# Patient Record
Sex: Female | Born: 1990 | Race: White | Hispanic: No | Marital: Single | Smoking: Current every day smoker
Health system: Southern US, Community
[De-identification: ages and names within clinical notes are randomized; demographics above are authoritative.]

## PROBLEM LIST (undated history)

## (undated) DIAGNOSIS — I471 Supraventricular tachycardia, unspecified: Secondary | ICD-10-CM

## (undated) HISTORY — PX: FRACTURE SURGERY: SHX138

## (undated) HISTORY — PX: CARDIAC ELECTROPHYSIOLOGY STUDY AND ABLATION: SHX1294

---

## 2014-12-11 ENCOUNTER — Encounter (HOSPITAL_COMMUNITY): Payer: Self-pay | Admitting: Emergency Medicine

## 2014-12-11 ENCOUNTER — Emergency Department (HOSPITAL_COMMUNITY): Payer: Self-pay

## 2014-12-11 ENCOUNTER — Emergency Department (HOSPITAL_COMMUNITY)
Admission: EM | Admit: 2014-12-11 | Discharge: 2014-12-11 | Discharge status: Against Medical Advice | Payer: Self-pay | Attending: Emergency Medicine | Admitting: Emergency Medicine

## 2014-12-11 DIAGNOSIS — Z8679 Personal history of other diseases of the circulatory system: Secondary | ICD-10-CM | POA: Insufficient documentation

## 2014-12-11 DIAGNOSIS — Z79899 Other long term (current) drug therapy: Secondary | ICD-10-CM | POA: Insufficient documentation

## 2014-12-11 DIAGNOSIS — R079 Chest pain, unspecified: Secondary | ICD-10-CM | POA: Insufficient documentation

## 2014-12-11 DIAGNOSIS — R002 Palpitations: Secondary | ICD-10-CM | POA: Insufficient documentation

## 2014-12-11 DIAGNOSIS — R000 Tachycardia, unspecified: Secondary | ICD-10-CM

## 2014-12-11 HISTORY — DX: Supraventricular tachycardia, unspecified: I47.10

## 2014-12-11 HISTORY — DX: Supraventricular tachycardia: I47.1

## 2014-12-11 NOTE — ED Notes (Addendum)
Pt. Stated  " i wanna go home now, im feeling better, i wanna sit around home than here.'

## 2014-12-11 NOTE — ED Notes (Signed)
Pt presents with chest pain, pt states she has hx of SVT with cardiac ablations x 2. Pt states she goes in/out of SVT daily, takes Cardizem. Pt states she awoke with heart racing, took Cardizem but heart continued to race.

## 2014-12-11 NOTE — ED Notes (Signed)
Bed: WA03 Expected date:  Expected time:  Means of arrival:  Comments: 

## 2014-12-11 NOTE — ED Provider Notes (Signed)
Medical screening examination/treatment/procedure(s) were performed by non-physician practitioner and as supervising physician I was immediately available for consultation/collaboration.   EKG Interpretation   Date/Time:  Saturday December 11 2014 04:02:21 EST Ventricular Rate:  112 PR Interval:  173 QRS Duration: 89 QT Interval:  383 QTC Calculation: 523 R Axis:   83 Text Interpretation:  Sinus tachycardia Borderline T wave abnormalities  Prolonged QT interval Confirmed by [facility]  [facility], Laikynn Pollio ([id number]) on  12/11/2014 5:42:33 AM        EKG Interpretation   Date/Time:  Saturday December 11 2014 04:02:21 EST Ventricular Rate:  112 PR Interval:  173 QRS Duration: 89 QT Interval:  383 QTC Calculation: 523 R Axis:   83 Text Interpretation:  Sinus tachycardia Borderline T wave abnormalities  Prolonged QT interval Confirmed by [facility]  [facility], Haneen Bernales ([id number]) on  12/11/2014 5:42:33 AM       Ayvah Caroll Smitty CordsK Magnum Lunde-Rasch, MD 12/11/14 [phone]

## 2014-12-11 NOTE — ED Provider Notes (Signed)
CSN: [id number]     Arrival date & time 12/11/14  0341 History   First MD Initiated Contact with Patient 12/11/14 [phone]     Chief Complaint  Patient presents with  . Chest Pain     (Consider location/radiation/quality/duration/timing/severity/associated sxs/prior Treatment) HPI Kristy Hubbard is a 24 y.o. female with hx of SVT, [facility], presents to ED with complaint of chest pain and palpitations. Patient states her symptoms began around 3 AM. She states [place] is visiting her friend here. States she normally takes Cardizem when she starts feeling palpitations, states tooka dose after she felt her symptoms, and when her palpitations did not improve she decided to come to the ER. Upon arrival to ED, patient states she is feeling much better. She refused any blood work or x-rays by Education administratornursing staff, and wants to go home. She denies any symptoms at this time. She states she has SVT every day, and that is why she is on Cardizem. She denies any recent illness, no new medications, no new over the counter medications.   Past Medical History  Diagnosis Date  . SVT (supraventricular tachycardia)    Past Surgical History  Procedure Laterality Date  . Cardiac electrophysiology study and ablation    . Fracture surgery Left     Arm   No family history on file. History  Substance Use Topics  . Smoking status: Never Smoker   . Smokeless tobacco: Not on file  . Alcohol Use: Yes     Comment: social   OB History    No data available     Review of Systems  Constitutional: Negative for fever and chills.  Respiratory: Positive for chest tightness. Negative for cough and shortness of breath.   Cardiovascular: Positive for chest pain and palpitations. Negative for leg swelling.  Gastrointestinal: Negative for nausea, vomiting, abdominal pain and diarrhea.  Genitourinary: Negative for dysuria and flank pain.  Musculoskeletal: Negative for  myalgias, arthralgias, neck pain and neck stiffness.  Skin: Negative for rash.  Neurological: Negative for dizziness, weakness and headaches.  All other systems reviewed and are negative.     Allergies  Morphine and related  Home Medications   Prior to Admission medications   Medication Sig Start Date End Date Taking? Authorizing Provider  diltiazem (CARDIZEM) 120 MG tablet Take 120 mg by mouth 3 (three) times daily as needed.   Yes Historical Provider, MD  medroxyPROGESTERone (DEPO-PROVERA) 150 MG/ML injection Inject 150 mg into the muscle every 3 (three) months.   Yes Historical Provider, MD   BP 138/84 mmHg  Pulse 97  Temp(Src) 97.8 F (36.6 C) (Oral)  Resp 23  Ht 4\' 11"  (1.499 m)  Wt 125 lb (56.7 kg)  BMI 25.23 kg/m2  SpO2 100%  LMP 12/10/2014 Physical Exam  Constitutional: She is oriented to person, place, and time. She appears well-developed and well-nourished. No distress.  Eyes: Conjunctivae are normal.  Neck: Neck supple.  Cardiovascular: Normal rate, regular rhythm and normal heart sounds.   No murmur heard. Pulmonary/Chest: Effort normal and breath sounds normal. No respiratory distress. She has no wheezes. She has no rales.  Musculoskeletal: She exhibits no edema.  Neurological: She is alert and oriented to person, place, and time.  Skin: Skin is warm and dry.  Nursing note and vitals reviewed.   ED Course  Procedures (including critical care time) Labs Review Labs Reviewed  CBC WITH DIFFERENTIAL/PLATELET  I-STAT CHEM 8, ED  Rosezena Sensor, ED  POC URINE PREG, ED    Imaging Review No results found.   EKG Interpretation   Date/Time:  Saturday December 11 2014 04:02:21 EST Ventricular Rate:  112 PR Interval:  173 QRS Duration: 89 QT Interval:  383 QTC Calculation: 523 R Axis:   83 Text Interpretation:  Sinus tachycardia Borderline T wave abnormalities  Prolonged QT interval Confirmed by [facility]  MD, APRIL ([id number]) on  12/11/2014  5:42:33 AM      MDM   Final diagnoses:  Chest pain, unspecified chest pain type     patient with history of SVT, states felt like her heart was racing earlier this morning, no improvement after taking her Cardizem so came to the emergency department. Patient is followed by cardiology in [facility]. She states "I get SVT daily, and take my Cardizem as needed." By the time I saw the patient, she has been in emergency for over 2 hours. Her initial EKG just showed sinus tachycardia. No evidence of SVT. She has never been to the ER here before with similar complaints. Care everywhere reviewed, no recent hospital visits for SVT, although there is a mention that she has that as her history. Given patient's initial sinus tachycardia, being on birth control, and having chest pain palpitations, labwork and chest x-ray ordered. Patient refused any testing, stating that she now feels back to normal. She denies any current complaints. She appears to be back in sinus rhythm with heart rate in the 90s. Discussed with Dr. Daun Peacock who recommended AMA discharge. At this time without any lab work or imaging cannot rule out PE or any other cardiac abnormality.  Filed Vitals:   12/11/14 0356 12/11/14 0517  BP: 134/90 138/84  Pulse: 130 97  Temp: 97.8 F (36.6 C)   TempSrc: Oral   Resp: 15 23  Height:  (1.499 m)   Weight: 125 lb (56.7 kg)   SpO2: 100% 100%     Lottie Mussel, PA-C 12/11/14 0920

## 2014-12-11 NOTE — ED Notes (Signed)
Pt. Signed AMA

## 2014-12-11 NOTE — ED Notes (Signed)
Pt. REFUSED all lab works ordered by MD. Stated " i knew what's my problem and now im feeling fine. I just want to go home." to notify MD.

## 2015-02-13 ENCOUNTER — Emergency Department (HOSPITAL_COMMUNITY)
Admission: EM | Admit: 2015-02-13 | Discharge: 2015-02-13 | Disposition: A | Discharge status: Home / Self Care | Payer: BLUE CROSS/BLUE SHIELD | Attending: Emergency Medicine | Admitting: Emergency Medicine

## 2015-02-13 ENCOUNTER — Encounter (HOSPITAL_COMMUNITY): Payer: Self-pay | Admitting: Emergency Medicine

## 2015-02-13 DIAGNOSIS — X58XXXA Exposure to other specified factors, initial encounter: Secondary | ICD-10-CM | POA: Diagnosis not present

## 2015-02-13 DIAGNOSIS — Z720 Tobacco use: Secondary | ICD-10-CM | POA: Diagnosis not present

## 2015-02-13 DIAGNOSIS — Y929 Unspecified place or not applicable: Secondary | ICD-10-CM | POA: Diagnosis not present

## 2015-02-13 DIAGNOSIS — Z8679 Personal history of other diseases of the circulatory system: Secondary | ICD-10-CM | POA: Diagnosis not present

## 2015-02-13 DIAGNOSIS — H5712 Ocular pain, left eye: Secondary | ICD-10-CM | POA: Diagnosis not present

## 2015-02-13 DIAGNOSIS — Y939 Activity, unspecified: Secondary | ICD-10-CM | POA: Diagnosis not present

## 2015-02-13 DIAGNOSIS — Y999 Unspecified external cause status: Secondary | ICD-10-CM | POA: Insufficient documentation

## 2015-02-13 DIAGNOSIS — T1592XA Foreign body on external eye, part unspecified, left eye, initial encounter: Secondary | ICD-10-CM | POA: Diagnosis present

## 2015-02-13 MED ORDER — FLUORESCEIN SODIUM 1 MG OP STRP
1.0000 | ORAL_STRIP | Freq: Once | OPHTHALMIC | Status: AC
  Administered 2015-02-13: 1 via OPHTHALMIC
  Filled 2015-02-13: qty 1

## 2015-02-13 NOTE — ED Notes (Signed)
C/o contact stuck on L eye x 5 days.  Denies pain.

## 2015-02-13 NOTE — ED Provider Notes (Signed)
CSN: [id number]     Arrival date & time 02/13/15  1901 History  This chart was scribed for non-physician provider Emilia Beck, PA-C, working with Richardean Canal, MD by Phillis Haggis, ED Scribe. This patient was seen in room TR04C/TR04C and patient care was started at 8:10 PM.   Chief Complaint  Patient presents with  . Foreign Body in Eye   Patient is a 24 y.o. female presenting with foreign body in eye. The history is provided by the patient. No language interpreter was used.  Foreign Body in Eye  HPI Comments: Kristy Hubbard is a 24 y.o. female who presents to the Emergency Department complaining of a foreign body in the eye onset 5 days ago. She states that she got a contact stuck in her left eye and was able to move it around but not able to get it out. She denies pain to the area.    Past Medical History  Diagnosis Date  . SVT (supraventricular tachycardia)    Past Surgical History  Procedure Laterality Date  . Cardiac electrophysiology study and ablation    . Fracture surgery Left     Arm   No family history on file. History  Substance Use Topics  . Smoking status: Current Every Day Smoker  . Smokeless tobacco: Not on file  . Alcohol Use: Yes     Comment: social   OB History    No data available     Review of Systems  Eyes: Positive for visual disturbance.  All other systems reviewed and are negative.  Allergies  Morphine and related  Home Medications   Prior to Admission medications   Medication Sig Start Date End Date Taking? Authorizing Provider  diltiazem (CARDIZEM) 120 MG tablet Take 120 mg by mouth 3 (three) times daily as needed.    Historical Provider, MD  medroxyPROGESTERone (DEPO-PROVERA) 150 MG/ML injection Inject 150 mg into the muscle every 3 (three) months.    Historical Provider, MD   BP 126/91 mmHg  Pulse 112  Temp(Src) 98.7 F (37.1 C) (Oral)  Resp 16  Ht 5' (1.524 m)  Wt 129 lb 12.8 oz (58.877 kg)  BMI 25.35 kg/m2  SpO2  100%  Physical Exam  Constitutional: She is oriented to person, place, and time. She appears well-developed and well-nourished. No distress.  HENT:  Head: Normocephalic and atraumatic.  Mouth/Throat: Oropharynx is clear and moist.  Eyes: Conjunctivae and EOM are normal. Pupils are equal, round, and reactive to light.  No foreign body seen. No corneal abrasion noted on fluorescin dye exam.   Neck: Normal range of motion. Neck supple.  Cardiovascular: Normal rate, regular rhythm and normal heart sounds.   Pulmonary/Chest: Effort normal and breath sounds normal. No respiratory distress.  Abdominal: Soft. She exhibits no distension. There is no tenderness. There is no rebound.  Musculoskeletal: Normal range of motion. She exhibits no edema.  Neurological: She is alert and oriented to person, place, and time. No sensory deficit. Coordination normal.  Skin: Skin is warm and dry.  Psychiatric: She has a normal mood and affect. Her behavior is normal.  Nursing note and vitals reviewed.   ED Course  Procedures (including critical care time) DIAGNOSTIC STUDIES: Oxygen Saturation is 100% on room air, normal by my interpretation.    COORDINATION OF CARE: 8:12 PM-Discussed treatment plan which includes numbing eye drops and removal of foreign body with pt at bedside and pt agreed to plan.   Labs Review Labs Reviewed - No data  to display  Imaging Review No results found.   EKG Interpretation None      MDM   Final diagnoses:  Left eye pain    No foreign body or corneal abrasion noted.   I personally performed the services described in this documentation, which was scribed in my presence. The recorded information has been reviewed and is accurate.     Emilia BeckKaitlyn Jorene Kaylor, PA-C 02/13/15 2029  Richardean Canalavid H Yao, MD 02/14/15 [phone]

## 2015-02-13 NOTE — Discharge Instructions (Signed)
Follow up with your Ophthalmologist for further evaluation of your eye.

## 2015-06-02 ENCOUNTER — Emergency Department (HOSPITAL_COMMUNITY): Payer: BLUE CROSS/BLUE SHIELD

## 2015-06-02 ENCOUNTER — Encounter (HOSPITAL_COMMUNITY): Payer: Self-pay | Admitting: *Deleted

## 2015-06-02 ENCOUNTER — Emergency Department (HOSPITAL_COMMUNITY)
Admission: EM | Admit: 2015-06-02 | Discharge: 2015-06-02 | Disposition: A | Discharge status: Home / Self Care | Payer: BLUE CROSS/BLUE SHIELD | Attending: Emergency Medicine | Admitting: Emergency Medicine

## 2015-06-02 DIAGNOSIS — I471 Supraventricular tachycardia: Secondary | ICD-10-CM | POA: Insufficient documentation

## 2015-06-02 DIAGNOSIS — Z793 Long term (current) use of hormonal contraceptives: Secondary | ICD-10-CM | POA: Diagnosis not present

## 2015-06-02 DIAGNOSIS — R079 Chest pain, unspecified: Secondary | ICD-10-CM | POA: Diagnosis not present

## 2015-06-02 DIAGNOSIS — R002 Palpitations: Secondary | ICD-10-CM | POA: Diagnosis present

## 2015-06-02 DIAGNOSIS — Z720 Tobacco use: Secondary | ICD-10-CM | POA: Insufficient documentation

## 2015-06-02 DIAGNOSIS — R0602 Shortness of breath: Secondary | ICD-10-CM | POA: Diagnosis not present

## 2015-06-02 LAB — CBC
HCT: 44.7 % (ref 36.0–46.0)
Hemoglobin: 15.6 g/dL — ABNORMAL HIGH (ref 12.0–15.0)
MCH: 29.4 pg (ref 26.0–34.0)
MCHC: 34.9 g/dL (ref 30.0–36.0)
MCV: 84.3 fL (ref 78.0–100.0)
PLATELETS: 285 10*3/uL (ref 150–400)
RBC: 5.3 MIL/uL — ABNORMAL HIGH (ref 3.87–5.11)
RDW: 11.9 % (ref 11.5–15.5)
WBC: 11.2 10*3/uL — ABNORMAL HIGH (ref 4.0–10.5)

## 2015-06-02 LAB — BASIC METABOLIC PANEL
Anion gap: 12 (ref 5–15)
BUN: 12 mg/dL (ref 6–20)
CHLORIDE: 105 mmol/L (ref 101–111)
CO2: 22 mmol/L (ref 22–32)
CREATININE: 0.97 mg/dL (ref 0.44–1.00)
Calcium: 9.9 mg/dL (ref 8.9–10.3)
GFR calc Af Amer: >60 mL/min (ref >60)
GFR calc non Af Amer: >60 mL/min (ref >60)
GLUCOSE: 110 mg/dL — AB (ref 65–99)
Potassium: 4.1 mmol/L (ref 3.5–5.1)
SODIUM: 139 mmol/L (ref 135–145)

## 2015-06-02 LAB — I-STAT TROPONIN, ED: Troponin i, poc: 0 ng/mL (ref 0.00–0.08)

## 2015-06-02 MED ORDER — LORAZEPAM 2 MG/ML IJ SOLN
1.0000 mg | Freq: Once | INTRAMUSCULAR | Status: AC
  Administered 2015-06-02: 1 mg via INTRAVENOUS
  Filled 2015-06-02: qty 1

## 2015-06-02 MED ORDER — METOPROLOL TARTRATE 1 MG/ML IV SOLN
10.0000 mg | INTRAVENOUS | Status: AC
  Administered 2015-06-02: 10 mg via INTRAVENOUS
  Filled 2015-06-02: qty 10

## 2015-06-02 MED ORDER — DILTIAZEM HCL 25 MG/5ML IV SOLN
20.0000 mg | Freq: Once | INTRAVENOUS | Status: AC
  Administered 2015-06-02: 20 mg via INTRAVENOUS
  Filled 2015-06-02: qty 5

## 2015-06-02 MED ORDER — ADENOSINE 6 MG/2ML IV SOLN
12.0000 mg | Freq: Once | INTRAVENOUS | Status: AC
  Administered 2015-06-02: 12 mg via INTRAVENOUS
  Filled 2015-06-02: qty 4

## 2015-06-02 MED ORDER — SODIUM CHLORIDE 0.9 % IV BOLUS (SEPSIS)
1000.0000 mL | Freq: Once | INTRAVENOUS | Status: AC
  Administered 2015-06-02: 1000 mL via INTRAVENOUS

## 2015-06-02 NOTE — ED Notes (Signed)
Bed side Commode placed in room.

## 2015-06-02 NOTE — Discharge Instructions (Signed)
Pharmaceutical Cardioversion Pharmaceutical cardioversion is the use of medicine to convert an abnormal heart rhythm back to normal. This treatment may be used when the heart rhythm is not dangerous and vital signs, such as blood pressure, stay in the normal range. Medicine may be used first to treat a fast and irregular heart rhythm (atrial fibrillation). You may have noticed it only because you had mild shortness of breath, were tired, felt dizzy, or felt your heart beating fast.  WHAT CAN I EXPECT?  You may have to stay in the hospital for a few days so your heart rhythm can be monitored all the time.  There are several medicines that may be used depending on the rhythm of your heart.  It may take a combination of medicines before the rhythm changes.  If medicine does not work, Lobbyist cardioversion may be used. This involves putting you briefly to sleep and applying two paddles to the front of your chest to deliver a quick electrical current. This adjusts your heart rhythm back to normal.  Once your heart rhythm is normal again, you may still need to take medicine to increase your chances of keeping a regular heart rhythm. Document Released: 07/23/2006 Document Revised: 07/22/2013 Document Reviewed: 04/08/2013 [facility] Patient Information 2015 [place], [place]. This information is not intended to replace advice given to you by your health care provider. Make sure you discuss any questions you have with your health care provider.

## 2015-06-02 NOTE — ED Notes (Signed)
PT placed on ZOLL. MD at bedside. PT having runs of SVT in the 160s with no response to vagal maneuvers. Pt placed on 2L [place] O2. Suction set up at bedside.

## 2015-06-02 NOTE — ED Notes (Signed)
Pt HR dropped form 137 to 81 but went back up to 130s.

## 2015-06-02 NOTE — ED Notes (Signed)
Pt tried vagal maneuver. HR came down to the 70s for several beats but is now 105.

## 2015-06-02 NOTE — ED Notes (Signed)
Pt no longer has chest pain.

## 2015-06-02 NOTE — ED Notes (Signed)
Pt reports palpitations with central chest pain and shortness of breath starting about a hour ago. Pt states she has a hx of SVT and cardiac ablations. Pt reports taking her Cardizem without relief.

## 2015-06-02 NOTE — ED Notes (Signed)
MD updated about patients HR.

## 2015-06-02 NOTE — ED Provider Notes (Signed)
CSN: [id number]     Arrival date & time 06/02/15  1959 History   First MD Initiated Contact with Patient 06/02/15 2029     Chief Complaint  Patient presents with  . Palpitations  . Tachycardia     (Consider location/radiation/quality/duration/timing/severity/associated sxs/prior Treatment) Patient is a 24 y.o. female presenting with palpitations. The history is provided by the patient.  Palpitations Palpitations quality:  Fast Onset quality:  Sudden Duration:  2 hours Timing:  Constant Progression:  Unchanged Chronicity:  New Context: anxiety and caffeine   Relieved by:  Nothing Worsened by:  Nothing Ineffective treatments: Diltiazem. Associated symptoms: chest pain and shortness of breath   Associated symptoms: no dizziness, no nausea and no vomiting     24 yo F with a chief complaint of palpitations. Patient states this happens to her just about every day. Patient has diltiazem at home which she takes for this. Normally this resolves in about 15 minutes but this one has persisted. Associated with some shortness of breath and some chest pressure. Patient states having to her multiple times and she is getting a medicine but is not sure what exactly that she is given that resolved this. Had an ablation and [facility].  Past Medical History  Diagnosis Date  . SVT (supraventricular tachycardia)    Past Surgical History  Procedure Laterality Date  . Cardiac electrophysiology study and ablation    . Fracture surgery Left     Arm   History reviewed. No pertinent family history. Social History  Substance Use Topics  . Smoking status: Current Every Day Smoker  . Smokeless tobacco: None  . Alcohol Use: Yes     Comment: social   OB History    No data available     Review of Systems  Constitutional: Negative for fever and chills.  HENT: Negative for congestion and rhinorrhea.   Eyes: Negative for redness and visual disturbance.  Respiratory: Positive for  shortness of breath. Negative for wheezing.   Cardiovascular: Positive for chest pain. Negative for palpitations.  Gastrointestinal: Negative for nausea and vomiting.  Genitourinary: Negative for dysuria and urgency.  Musculoskeletal: Negative for myalgias and arthralgias.  Skin: Negative for pallor and wound.  Neurological: Negative for dizziness and headaches.      Allergies  Morphine and related  Home Medications   Prior to Admission medications   Medication Sig Start Date End Date Taking? Authorizing Provider  diltiazem (CARDIZEM) 120 MG tablet Take 120 mg by mouth 3 (three) times daily as needed. For a-fib   Yes Historical Provider, MD  medroxyPROGESTERone (DEPO-PROVERA) 150 MG/ML injection Inject 150 mg into the muscle every 3 (three) months.   Yes Historical Provider, MD   BP 129/86 mmHg  Pulse 97  Temp(Src) 98.4 F (36.9 C) (Oral)  Resp 18  Ht 4\' 11"  (1.499 m)  Wt 139 lb (63.05 kg)  BMI 28.06 kg/m2  SpO2 100% Physical Exam  Constitutional: She is oriented to person, place, and time. She appears well-developed and well-nourished. No distress.  HENT:  Head: Normocephalic and atraumatic.  Eyes: EOM are normal. Pupils are equal, round, and reactive to light.  Neck: Normal range of motion. Neck supple.  Cardiovascular: Regular rhythm.  Tachycardia present.  Exam reveals no gallop and no friction rub.   No murmur heard. Pulmonary/Chest: Effort normal. She has no wheezes. She has no rales.  Abdominal: Soft. She exhibits no distension. There is no tenderness. There is no rebound and no guarding.  Musculoskeletal: She exhibits  no edema or tenderness.  Neurological: She is alert and oriented to person, place, and time.  Skin: Skin is warm and dry. She is not diaphoretic.  Psychiatric: She has a normal mood and affect. Her behavior is normal.    ED Course  Procedures (including critical care time) Labs Review Labs Reviewed  BASIC METABOLIC PANEL - Abnormal; Notable for  the following:    Glucose, Bld 110 (*)    All other components within normal limits  CBC - Abnormal; Notable for the following:    WBC 11.2 (*)    RBC 5.30 (*)    Hemoglobin 15.6 (*)    All other components within normal limits  I-STAT TROPOININ, ED    Imaging Review Dg Chest Portable 1 View  06/02/2015   CLINICAL DATA:  SVT.  Complains of centralized chest pain.  EXAM: PORTABLE CHEST - 1 VIEW  COMPARISON:  None.  FINDINGS: Cardiac pad overlies the left lower chest. Both lungs are clear. Negative for a pneumothorax. Heart size is normal. The trachea is midline.  IMPRESSION: No focal chest disease.   Electronically Signed   By: Richarda Overlie M.D.   On: 06/02/2015 22:00   I have personally reviewed and evaluated these images and lab results as part of my medical decision-making.   EKG Interpretation   Date/Time:  Thursday June 02 2015 20:09:52 EDT Ventricular Rate:  144 PR Interval:    QRS Duration: 86 QT Interval:  356 QTC Calculation: 551 R Axis:   85 Text Interpretation:  Supraventricular tachycardia ST \\T \ T wave  abnormality, consider inferior ischemia Abnormal ECG Confirmed by Treniya Lobb  MD, Reuel Boom ([id number]) on 06/02/2015 9:26:01 PM      MDM   Final diagnoses:  SVT (supraventricular tachycardia)    24 yo F with a chief complaint of SVT. Rate not entirely typical for SVT as it is in the 140s. Attempted vagal maneuvers 4 without improvement. Some transient improvement into the upper 90s with recurrence. Attempted a adenosine1 without improvement. Patient given 20 mg of Cardizem and then 10 mg of metoprolol with improvement down below 100. Sustained for about 30 minutes. Feel that this may be sinus tachycardia. Patient with no other symptoms to suggest PE or infectious or pain etiology. Patient with multiple episodes of this in the past possible ectopic focus in the atria causing this issue. Feeling much better requesting discharge home.  Suggested that the patient follow-up with  her cardiologist.  11:45 PM:  I have discussed the diagnosis/risks/treatment options with the patient and family and believe the pt to be eligible for discharge home to follow-up with Cardiology. We also discussed returning to the ED immediately if new or worsening sx occur. We discussed the sx which are most concerning (e.g., reoccurance) that necessitate immediate return. Medications administered to the patient during their visit and any new prescriptions provided to the patient are listed below.  Medications given during this visit Medications  adenosine (ADENOCARD) 6 MG/2ML injection 12 mg (12 mg Intravenous Given 06/02/15 2102)  sodium chloride 0.9 % bolus 1,000 mL (0 mLs Intravenous Stopped 06/02/15 2211)  diltiazem (CARDIZEM) injection 20 mg (20 mg Intravenous Given 06/02/15 2121)  LORazepam (ATIVAN) injection 1 mg (1 mg Intravenous Given 06/02/15 2130)  metoprolol (LOPRESSOR) injection 10 mg (10 mg Intravenous Given 06/02/15 2228)    Discharge Medication List as of 06/02/2015 10:52 PM       The patient appears reasonably screen and/or stabilized for discharge and I doubt any other medical  condition or other [facility] requiring further screening, evaluation, or treatment in the ED at this time prior to discharge.      Melene Plan, DO 06/02/15 [phone]

## 2015-06-02 NOTE — ED Notes (Signed)
Dr. Floyd at bedside. 

## 2015-06-03 ENCOUNTER — Observation Stay (HOSPITAL_COMMUNITY)
Admission: EM | Admit: 2015-06-03 | Discharge: 2015-06-04 | Disposition: A | Discharge status: Home / Self Care | Payer: BLUE CROSS/BLUE SHIELD | Attending: Student in an Organized Health Care Education/Training Program | Admitting: Student in an Organized Health Care Education/Training Program

## 2015-06-03 ENCOUNTER — Encounter (HOSPITAL_COMMUNITY): Payer: Self-pay | Admitting: Emergency Medicine

## 2015-06-03 DIAGNOSIS — R079 Chest pain, unspecified: Secondary | ICD-10-CM | POA: Insufficient documentation

## 2015-06-03 DIAGNOSIS — Z79899 Other long term (current) drug therapy: Secondary | ICD-10-CM | POA: Insufficient documentation

## 2015-06-03 DIAGNOSIS — R0602 Shortness of breath: Secondary | ICD-10-CM | POA: Diagnosis not present

## 2015-06-03 DIAGNOSIS — F17200 Nicotine dependence, unspecified, uncomplicated: Secondary | ICD-10-CM | POA: Insufficient documentation

## 2015-06-03 DIAGNOSIS — R42 Dizziness and giddiness: Secondary | ICD-10-CM | POA: Insufficient documentation

## 2015-06-03 DIAGNOSIS — F419 Anxiety disorder, unspecified: Secondary | ICD-10-CM | POA: Insufficient documentation

## 2015-06-03 DIAGNOSIS — E669 Obesity, unspecified: Secondary | ICD-10-CM | POA: Diagnosis present

## 2015-06-03 DIAGNOSIS — R002 Palpitations: Secondary | ICD-10-CM | POA: Diagnosis present

## 2015-06-03 DIAGNOSIS — Z720 Tobacco use: Secondary | ICD-10-CM | POA: Diagnosis present

## 2015-06-03 DIAGNOSIS — K589 Irritable bowel syndrome without diarrhea: Secondary | ICD-10-CM | POA: Diagnosis present

## 2015-06-03 DIAGNOSIS — I4581 Long QT syndrome: Secondary | ICD-10-CM | POA: Diagnosis not present

## 2015-06-03 DIAGNOSIS — R61 Generalized hyperhidrosis: Secondary | ICD-10-CM | POA: Diagnosis not present

## 2015-06-03 DIAGNOSIS — I471 Supraventricular tachycardia: Principal | ICD-10-CM | POA: Insufficient documentation

## 2015-06-03 DIAGNOSIS — Z6827 Body mass index (BMI) 27.0-27.9, adult: Secondary | ICD-10-CM | POA: Insufficient documentation

## 2015-06-03 DIAGNOSIS — Z885 Allergy status to narcotic agent status: Secondary | ICD-10-CM | POA: Diagnosis not present

## 2015-06-03 DIAGNOSIS — F17210 Nicotine dependence, cigarettes, uncomplicated: Secondary | ICD-10-CM | POA: Diagnosis not present

## 2015-06-03 DIAGNOSIS — R000 Tachycardia, unspecified: Secondary | ICD-10-CM | POA: Diagnosis not present

## 2015-06-03 LAB — I-STAT CHEM 8, ED
BUN: 12 mg/dL (ref 6–20)
CALCIUM ION: 1.09 mmol/L — AB (ref 1.12–1.23)
CREATININE: 0.8 mg/dL (ref 0.44–1.00)
Chloride: 110 mmol/L (ref 101–111)
GLUCOSE: 97 mg/dL (ref 65–99)
HCT: 44 % (ref 36.0–46.0)
HEMOGLOBIN: 15 g/dL (ref 12.0–15.0)
Potassium: 3.5 mmol/L (ref 3.5–5.1)
Sodium: 141 mmol/L (ref 135–145)
TCO2: 17 mmol/L (ref 0–100)

## 2015-06-03 LAB — MAGNESIUM: Magnesium: 2.2 mg/dL (ref 1.7–2.4)

## 2015-06-03 LAB — URINALYSIS, ROUTINE W REFLEX MICROSCOPIC
Bilirubin Urine: NEGATIVE
GLUCOSE, UA: NEGATIVE mg/dL
HGB URINE DIPSTICK: NEGATIVE
KETONES UR: NEGATIVE mg/dL
LEUKOCYTES UA: NEGATIVE
Nitrite: NEGATIVE
PH: 6.5 (ref 5.0–8.0)
PROTEIN: NEGATIVE mg/dL
Specific Gravity, Urine: 1.006 (ref 1.005–1.030)
Urobilinogen, UA: 0.2 mg/dL (ref 0.0–1.0)

## 2015-06-03 LAB — TSH: TSH: 3.658 u[IU]/mL (ref 0.350–4.500)

## 2015-06-03 LAB — RAPID URINE DRUG SCREEN, HOSP PERFORMED
AMPHETAMINES: NOT DETECTED
BENZODIAZEPINES: NOT DETECTED
Barbiturates: NOT DETECTED
Cocaine: NOT DETECTED
Opiates: NOT DETECTED
TETRAHYDROCANNABINOL: NOT DETECTED

## 2015-06-03 LAB — TROPONIN I: Troponin I: <0.03 ng/mL (ref <0.031)

## 2015-06-03 LAB — I-STAT TROPONIN, ED: Troponin i, poc: 0 ng/mL (ref 0.00–0.08)

## 2015-06-03 LAB — HCG, SERUM, QUALITATIVE: Preg, Serum: NEGATIVE

## 2015-06-03 LAB — D-DIMER, QUANTITATIVE: D-Dimer, Quant: <0.27 ug/mL-FEU (ref 0.00–0.48)

## 2015-06-03 MED ORDER — ACETAMINOPHEN 325 MG PO TABS
650.0000 mg | ORAL_TABLET | Freq: Four times a day (QID) | ORAL | Status: DC | PRN
  Administered 2015-06-03: 650 mg via ORAL
  Filled 2015-06-03: qty 2

## 2015-06-03 MED ORDER — METOPROLOL TARTRATE 1 MG/ML IV SOLN
5.0000 mg | Freq: Once | INTRAVENOUS | Status: AC
  Administered 2015-06-03: 5 mg via INTRAVENOUS
  Filled 2015-06-03: qty 5

## 2015-06-03 MED ORDER — METOPROLOL TARTRATE 12.5 MG HALF TABLET: 12.5000 mg | ORAL_TABLET | Freq: Two times a day (BID) | ORAL | Status: DC

## 2015-06-03 MED ORDER — LORAZEPAM 2 MG/ML IJ SOLN
0.5000 mg | Freq: Once | INTRAMUSCULAR | Status: AC
  Administered 2015-06-03: 0.5 mg via INTRAVENOUS
  Filled 2015-06-03: qty 1

## 2015-06-03 MED ORDER — FENTANYL CITRATE (PF) 100 MCG/2ML IJ SOLN
25.0000 ug | Freq: Once | INTRAMUSCULAR | Status: AC
  Administered 2015-06-03: 25 ug via INTRAVENOUS
  Filled 2015-06-03: qty 2

## 2015-06-03 MED ORDER — LORAZEPAM 2 MG/ML IJ SOLN
1.0000 mg | Freq: Once | INTRAMUSCULAR | Status: AC
  Administered 2015-06-03: 1 mg via INTRAVENOUS
  Filled 2015-06-03: qty 1

## 2015-06-03 MED ORDER — ENOXAPARIN SODIUM 40 MG/0.4ML ~~LOC~~ SOLN
40.0000 mg | SUBCUTANEOUS | Status: DC
  Administered 2015-06-03: 40 mg via SUBCUTANEOUS
  Filled 2015-06-03 (×2): qty 0.4

## 2015-06-03 MED ORDER — METOPROLOL TARTRATE 25 MG PO TABS
25.0000 mg | ORAL_TABLET | Freq: Two times a day (BID) | ORAL | Status: DC
  Administered 2015-06-03 – 2015-06-04 (×2): 25 mg via ORAL
  Filled 2015-06-03 (×4): qty 1

## 2015-06-03 MED ORDER — IVABRADINE HCL 5 MG PO TABS
5.0000 mg | ORAL_TABLET | Freq: Two times a day (BID) | ORAL | Status: DC
  Administered 2015-06-03: 5 mg via ORAL
  Filled 2015-06-03 (×4): qty 1

## 2015-06-03 MED ORDER — NICOTINE 14 MG/24HR TD PT24
14.0000 mg | MEDICATED_PATCH | Freq: Every day | TRANSDERMAL | Status: DC
  Administered 2015-06-03 – 2015-06-04 (×2): 14 mg via TRANSDERMAL
  Filled 2015-06-03 (×3): qty 1

## 2015-06-03 MED ORDER — DIPHENHYDRAMINE HCL 25 MG PO CAPS
50.0000 mg | ORAL_CAPSULE | Freq: Every evening | ORAL | Status: DC | PRN
  Administered 2015-06-03: 50 mg via ORAL
  Filled 2015-06-03: qty 2

## 2015-06-03 MED ORDER — HYDROMORPHONE HCL 1 MG/ML IJ SOLN
1.0000 mg | Freq: Once | INTRAMUSCULAR | Status: AC
  Administered 2015-06-03: 1 mg via INTRAVENOUS
  Filled 2015-06-03: qty 1

## 2015-06-03 NOTE — ED Notes (Signed)
Discussed restlessness, sweating, and patient movement in room with Dr. Ranae Palms. Md acknowledges. Verbal order for 0.5mg  of ativan.

## 2015-06-03 NOTE — ED Notes (Signed)
Informed patient to stay in bed while managing heart rate. Repositioned as requested, heart rate now 119

## 2015-06-03 NOTE — Consult Note (Signed)
Cardiologist:  New Reason for Consult: Tachycardia Referring Physician:   [name] is an 24 y.o. female.  HPI:   The patient is a 24 yo female with a history of SVT.  She is SP unsuccessful ablation when she was 16.  She is followed by Dr. Leeann Hubbard in [place] but has not seen him since 2012.  She is taking Cardizem 120mg  daily for the  last year which was suppose to be PRN.  Yesterday she took at least two doses.  She was laying on the coach when her HR increased.  She reports nausea but this mostly occurred after getting several IV pain meds yesterday.  IV lopressor in the ER help for a short period.  She had been exercising a lot up until 6 months ago.   Review of other office notes recently show her HR ranges from 84-120.  She also reports daily chest pain for years.    Past Medical History  Diagnosis Date  . SVT (supraventricular tachycardia)     Past Surgical History  Procedure Laterality Date  . Cardiac electrophysiology study and ablation    . Fracture surgery Left     Arm    No family history on file.  Social History:  reports that she has been smoking.  She does not have any smokeless tobacco history on file. She reports that she drinks alcohol. She reports that she does not use illicit drugs.  Allergies:  Allergies  Allergen Reactions  . Morphine And Related Rash    Medications:  Scheduled Meds: . enoxaparin (LOVENOX) injection  40 mg Subcutaneous Q24H  . nicotine  14 mg Transdermal Daily   Continuous Infusions:  PRN Meds:.   Results for orders placed or performed during the hospital encounter of 06/03/15 (from the past 48 hour(s))  D-dimer, quantitative (not at [facility])     Status: None   Collection Time: 06/03/15  2:57 AM  Result Value Ref Range   D-Dimer, Quant <0.27 0.00 - 0.48 ug/mL-FEU    Comment:        AT THE INHOUSE ESTABLISHED CUTOFF VALUE OF 0.48 ug/mL FEU, THIS ASSAY HAS BEEN DOCUMENTED IN THE LITERATURE TO HAVE A SENSITIVITY AND  NEGATIVE PREDICTIVE VALUE OF AT LEAST 98 TO 99%.  THE TEST RESULT SHOULD BE CORRELATED WITH AN ASSESSMENT OF THE CLINICAL PROBABILITY OF DVT / VTE.   hCG, serum, qualitative     Status: None   Collection Time: 06/03/15  2:57 AM  Result Value Ref Range   Preg, Serum NEGATIVE NEGATIVE    Comment:        THE SENSITIVITY OF THIS METHODOLOGY IS >10 mIU/mL.   TSH     Status: None   Collection Time: 06/03/15  2:57 AM  Result Value Ref Range   TSH 3.658 0.350 - 4.500 uIU/mL  I-stat troponin, ED     Status: None   Collection Time: 06/03/15  3:02 AM  Result Value Ref Range   Troponin i, poc 0.00 0.00 - 0.08 ng/mL   Comment 3            Comment: Due to the release kinetics of cTnI, a negative result within the first hours of the onset of symptoms does not rule out myocardial infarction with certainty. If myocardial infarction is still suspected, repeat the test at appropriate intervals.   I-stat chem 8, ed     Status: Abnormal   Collection Time: 06/03/15  3:04 AM  Result Value Ref Range  Sodium 141 135 - 145 mmol/L   Potassium 3.5 3.5 - 5.1 mmol/L   Chloride 110 101 - 111 mmol/L   BUN 12 6 - 20 mg/dL   Creatinine, Ser 1.61 0.44 - 1.00 mg/dL   Glucose, Bld 97 65 - 99 mg/dL   Calcium, Ion 0.96 (L) 1.12 - 1.23 mmol/L   TCO2 17 0 - 100 mmol/L   Hemoglobin 15.0 12.0 - 15.0 g/dL   HCT 04.5 40.9 - 81.1 %  Urine rapid drug screen (hosp performed)     Status: None   Collection Time: 06/03/15  5:44 AM  Result Value Ref Range   Opiates NONE DETECTED NONE DETECTED   Cocaine NONE DETECTED NONE DETECTED   Benzodiazepines NONE DETECTED NONE DETECTED   Amphetamines NONE DETECTED NONE DETECTED   Tetrahydrocannabinol NONE DETECTED NONE DETECTED   Barbiturates NONE DETECTED NONE DETECTED    Comment:        DRUG SCREEN FOR MEDICAL PURPOSES ONLY.  IF CONFIRMATION IS NEEDED FOR ANY PURPOSE, NOTIFY LAB WITHIN 5 DAYS.        LOWEST DETECTABLE LIMITS FOR URINE DRUG SCREEN Drug Class        Cutoff (ng/mL) Amphetamine      1000 Barbiturate      200 Benzodiazepine   200 Tricyclics       300 Opiates          300 Cocaine          300 THC              50   Urinalysis, Routine w reflex microscopic (not at [facility])     Status: Abnormal   Collection Time: 06/03/15  5:44 AM  Result Value Ref Range   Color, Urine STRAW (A) YELLOW   APPearance CLEAR CLEAR   Specific Gravity, Urine 1.006 1.005 - 1.030   pH 6.5 5.0 - 8.0   Glucose, UA NEGATIVE NEGATIVE mg/dL   Hgb urine dipstick NEGATIVE NEGATIVE   Bilirubin Urine NEGATIVE NEGATIVE   Ketones, ur NEGATIVE NEGATIVE mg/dL   Protein, ur NEGATIVE NEGATIVE mg/dL   Urobilinogen, UA 0.2 0.0 - 1.0 mg/dL   Nitrite NEGATIVE NEGATIVE   Leukocytes, UA NEGATIVE NEGATIVE    Comment: MICROSCOPIC NOT DONE ON URINES WITH NEGATIVE PROTEIN, BLOOD, LEUKOCYTES, NITRITE, OR GLUCOSE <1000 mg/dL.    Dg Chest Portable 1 View  06/02/2015   CLINICAL DATA:  SVT.  Complains of centralized chest pain.  EXAM: PORTABLE CHEST - 1 VIEW  COMPARISON:  None.  FINDINGS: Cardiac pad overlies the left lower chest. Both lungs are clear. Negative for a pneumothorax. Heart size is normal. The trachea is midline.  IMPRESSION: No focal chest disease.   Electronically Signed   By: Richarda Overlie M.D.   On: 06/02/2015 22:00    Review of Systems  Constitutional: Negative for fever.  Respiratory: Positive for shortness of breath. Negative for cough.   Cardiovascular: Positive for chest pain and palpitations. Negative for leg swelling.  Gastrointestinal: Positive for nausea.  Neurological: Negative for dizziness.  All other systems reviewed and are negative.  Blood pressure 109/66, pulse 94, temperature 98.1 F (36.7 C), temperature source Oral, resp. rate 20, weight 138 lb 12.8 oz (62.959 kg), SpO2 97 %. Physical Exam  Constitutional: She is oriented to person, place, and time. She appears well-developed and well-nourished. No distress.  HENT:  Head: Normocephalic and  atraumatic.  Eyes: EOM are normal. Pupils are equal, round, and reactive to light. No scleral icterus.  Neck: Normal range of motion. Neck supple.  Cardiovascular: Normal rate, regular rhythm, S1 normal and S2 normal.   No murmur heard. Pulses:      Radial pulses are 2+ on the right side, and 2+ on the left side.  Respiratory: Effort normal and breath sounds normal. She has no wheezes. She has no rales.  Musculoskeletal: She exhibits no edema.  Neurological: She is alert and oriented to person, place, and time. She exhibits normal muscle tone.  Skin: Skin is warm and dry.  Psychiatric: She has a normal mood and affect.    Assessment/Plan: Principal Problem:   inappropriateTachycardia   IBS (irritable bowel syndrome)   Tobacco abuse   Obesity    The patient's EKG looks like sinus tach despite the SVT label at the top.  She is taking Cardizem daily to try and eliminate the "SVT" which she thinks she is having.  I checked her HR when laying down was 105 and when she stood up 125.  She does not fit the criteria for postural tachycardia syndrome and yesterday she was laying down when it occurred.  However, it is worse now that she stopped exercising.  It seems as though she has a chronically elevated HR.   I suggest stopping the cardizem and try a low dose of lopressor.  I also recommend extended heart rhythm monitoring as an outpatient. Possible tilttable or EP study.   Monitor BP and titrate if possible. She does have a mildly elevated WBC.    She says she prefers to follow up with Dr. Leeann Hubbard in [place].   Troponin, D-dimer: negative.  TSH- WNL  Kristy Hubbard, Kristy Hubbard,  06/03/2015, 1:34 PM    Patient seen and examined with Wilburt Finlay, PA-C. We discussed all aspects of the encounter. I agree with the assessment and plan as stated above.   I have also reviewed the case and ECGs with the EP service. All strips appear to be sinus tachycardia and there is no evidence of SVT. That said she may have a  component of POTS vs inappropriate sinus tach. We will switch cardizem to lopressor 25 bid to avoid vasodilation. Also suggested salt and fluid liberalization.   I discussed the use of ivabradine (a Kf channel inhibitor) which is approved for HF use in the [place] but is used around the world for inappropriate sinus tach. I told her that the medication is expensive ($300/month) and insurance might not approve for off-label use but she would like to try it anyway. Will give her prescription for ivabradine 5mg  bid. She will f/u with Dr. Graciela Husbands. We will sign off. Stressed need for her to quit something.   Bensimhon, Daniel,MD 3:15 PM

## 2015-06-03 NOTE — ED Notes (Signed)
Called main lab, spoke to [place], to add on tsh.

## 2015-06-03 NOTE — ED Notes (Signed)
Pt seen earlier today in ED for SVT, states now having chest pain and SHOB.

## 2015-06-03 NOTE — ED Notes (Signed)
Updated Dr. Onalee Hua, cardio, on patient status. MD acknowledges.

## 2015-06-03 NOTE — ED Notes (Signed)
Called main lab in regards to ddimer. Spoke to rea.

## 2015-06-03 NOTE — ED Notes (Signed)
Dr. Yelverton at the bedside.  

## 2015-06-03 NOTE — ED Provider Notes (Signed)
CSN: [id number]     Arrival date & time 06/03/15  0139 History  This chart was scribed for Loren Racer, MD by Evon Slack, ED Scribe. This patient was seen in room D31C/D31C and the patient's care was started at 1:52 AM.    Chief Complaint  Patient presents with  . Chest Pain   The history is provided by the patient. No language interpreter was used.   HPI Comments: Kristy Hubbard is a 24 y.o. female who presents to the Emergency Department complaining of central CP onset PTA. Pt reports associated palpitations, SOB, nausea and dizziness. Pt states she has tried taking her Cardizem with no relief. Pt states that she was evaluated her in the ED earlier today and states that her symptoms resolved after receiving a beta blocker and ativan. She states that as soon as she got home and laid down her symptoms returned. Pt denies any recent caffeine use. She denies any recent long distant travel. Pt denies being on any anticoagulants. Pt denies recent medication change. Pt denies HX of anxiety.   Past Medical History  Diagnosis Date  . SVT (supraventricular tachycardia)    Past Surgical History  Procedure Laterality Date  . Cardiac electrophysiology study and ablation    . Fracture surgery Left     Arm   No family history on file. Social History  Substance Use Topics  . Smoking status: Current Every Day Smoker  . Smokeless tobacco: None  . Alcohol Use: Yes     Comment: social   OB History    No data available     Review of Systems  Constitutional: Negative for fever and chills.  Respiratory: Positive for shortness of breath. Negative for cough.   Cardiovascular: Positive for chest pain and palpitations. Negative for leg swelling.  Gastrointestinal: Positive for nausea. Negative for vomiting, abdominal pain and diarrhea.  Musculoskeletal: Negative for myalgias, back pain, neck pain and neck stiffness.  Skin: Negative for rash and wound.  Neurological: Positive for dizziness  and light-headedness. Negative for syncope, weakness, numbness and headaches.  All other systems reviewed and are negative.    Allergies  Morphine and related  Home Medications   Prior to Admission medications   Medication Sig Start Date End Date Taking? Authorizing Provider  diltiazem (CARDIZEM) 120 MG tablet Take 120 mg by mouth 3 (three) times daily as needed. For a-fib   Yes Historical Provider, MD  medroxyPROGESTERone (DEPO-PROVERA) 150 MG/ML injection Inject 150 mg into the muscle every 3 (three) months.   Yes Historical Provider, MD   BP 140/103 mmHg  Pulse 127  Temp(Src) 98.7 F (37.1 C) (Oral)  Resp 25  SpO2 99%   Physical Exam  Constitutional: She is oriented to person, place, and time. She appears well-developed and well-nourished. No distress.  Anxious appearing. Walking around the room.  HENT:  Head: Normocephalic and atraumatic.  Mouth/Throat: Oropharynx is clear and moist.  Eyes: EOM are normal. Pupils are equal, round, and reactive to light.  Neck: Normal range of motion. Neck supple. No thyromegaly present.  Cardiovascular: Regular rhythm.   Tachycardia  Pulmonary/Chest: Effort normal and breath sounds normal. No respiratory distress. She has no wheezes. She has no rales. She exhibits no tenderness.  Abdominal: Soft. Bowel sounds are normal. She exhibits no distension and no mass. There is no tenderness. There is no rebound and no guarding.  Musculoskeletal: Normal range of motion. She exhibits no edema or tenderness.  No lower extremity swelling or pain.  Neurological:  She is alert and oriented to person, place, and time.  5/5 motor in all extremities. Sensation is fully intact.  Skin: Skin is warm and dry. No rash noted. No erythema.  Psychiatric: She has a normal mood and affect. Her behavior is normal.  Nursing note and vitals reviewed.   ED Course  Procedures (including critical care time) DIAGNOSTIC STUDIES: Oxygen Saturation is 100% on RA, normal  by my interpretation.    COORDINATION OF CARE: 2:21 AM-Discussed treatment plan with pt at bedside and pt agreed to plan.     Labs Review Labs Reviewed  I-STAT CHEM 8, ED - Abnormal; Notable for the following:    Calcium, Ion 1.09 (*)    All other components within normal limits  D-DIMER, QUANTITATIVE (NOT AT [facility])  HCG, SERUM, QUALITATIVE  URINE RAPID DRUG SCREEN, HOSP PERFORMED  TSH  URINALYSIS, ROUTINE W REFLEX MICROSCOPIC (NOT AT [facility])  Rosezena Sensor, ED    Imaging Review Dg Chest Portable 1 View  06/02/2015   CLINICAL DATA:  SVT.  Complains of centralized chest pain.  EXAM: PORTABLE CHEST - 1 VIEW  COMPARISON:  None.  FINDINGS: Cardiac pad overlies the left lower chest. Both lungs are clear. Negative for a pneumothorax. Heart size is normal. The trachea is midline.  IMPRESSION: No focal chest disease.   Electronically Signed   By: Richarda Overlie M.D.   On: 06/02/2015 22:00   I have personally reviewed and evaluated these images and lab results as part of my medical decision-making.   EKG Interpretation   Date/Time:  Friday June 03 2015 01:43:10 EDT Ventricular Rate:  106 PR Interval:  136 QRS Duration: 90 QT Interval:  356 QTC Calculation: 472 R Axis:   76 Text Interpretation:  Sinus tachycardia Otherwise normal ECG Confirmed by  Briyana Badman  MD, Sansa Alkema ([id number]) on 06/03/2015 1:50:57 AM      MDM   Final diagnoses:  Palpitations      I personally performed the services described in this documentation, which was scribed in my presence. The recorded information has been reviewed and is accurate.  Reviewed the case with cardiology on-call, Dr. Onalee Hua. He examined patient's run sheet from this visit and prior visit. Also compared EKGs. Believes the patient has been in sinus rhythm without evidence of SVT. Suggests thyroid studies and UDS. If normal suggests discharge home to follow-up with the patient's cardiologist. Will redose patient's Ativan. There does to be some  component of anxiety.   Patient is to be symptomatic despite multiple doses of medication. Discussed with internal medicine resident who will admit for observation. Thyroid studies pending.   Pt's Cardiologist is Dr Leeann Must, [phone]  Loren Racer, MD 06/03/15 [phone]

## 2015-06-03 NOTE — ED Notes (Signed)
Called main lab, spoke to [place]. Urine drug screen is currently processing, urinalysis add on has been received.

## 2015-06-03 NOTE — H&P (Signed)
Date: 06/03/2015               Patient Name:  Kristy Hubbard MRN: [id number]  DOB: [birth year] Age / Sex: 24 y.o., female   PCP: No primary care provider on file.         Medical Service: Internal Medicine Teaching Service         Attending Physician: Dr. Burns Spain, MD    First Contact: Dr Selina Cooley Pager: [phone]  Second Contact: Dr. Andrey Campanile Pager: [phone]       After Hours (After 5p/  First Contact Pager: [phone]  weekends / holidays): Second Contact Pager: [phone]   Chief Complaint: Chest pain and racing heart rate.  History of Present Illness: 14 Y O F with PMH of SVT, Tobacco abuse, IBS, obesity presented with complaints of racing heart rate and chest pain that started yesterday while she was watching Tv. Chest pain is mostly in the center of her chest, associated with diaphoresis and SOB, with mild nausea. She also endorses subsequent increased heart rate. She denies use of illicit drugs, has had several episodes of exact same/similar episodes in the past- Chest pain with increased heart rates. She took 4 tablets of Cardizem- She thinks its is 120mg  strenght tablet. This has previously helped with her palpitations, but this time this did not help as much. She has rapid heart rates at baseline, she is not able to take part in strenous activity  as she says she feels she will pass out. Last time she had this palpitations severe enough to warrant Ed visit was about 3 months ago she says, she cannot remember the name of the hospital in [place]. Pt is adopted so family hx of cardiac arrhythmias is unknown. Pt has also been on phetermine for weight loss, but says she sold her last bottle of pills to a friend she has nmot taken this medication in at least 3 weeks. She also smokes cigs- started in her early teens and smokes 1/2 a pack to one pack per day.  She has an extensive history documented in care everywhere. She has been diagnosed with SVT, had EP studies in 2010, which  was unremarkable, pt thinks he might have had an ablation, but this is not documented also with negative EP study this is unlikely. She says she has also had stress testing done in the past. She stays in [place] now. He cardiologists are in [facility] It appears she was diagnosed when she was 24 yrs old, she was given a holter monitor to wear. She last saw them in 2-3 years ago, per chart 2012. She says she has not been good about keeping her appointments. Per notes from her cardiogist patient is supposed to be taking 240mg  of Cardizem PRN. Her heart rates at her clinic visits are always elevated in the 110s. She presented here today as she says she did not want to take the long drive to [place]. She stays in [place] now.  Pt presented earlier yesterday evening, with similar complaints, with heart rate up to 140s, vagal maneuvers and adenosine were given but heart rate responded to cardizem 20mg  and metop 10mg  , she came back again, later with chest pain, and increased heart rate, was given ativan, she was given Cardizem and metop, with some response but she still remained slightly tachycardic.   Meds: No current facility-administered medications for this encounter.   Current Outpatient Prescriptions  Medication Sig Dispense Refill  . diltiazem (CARDIZEM) 120 MG  tablet Take 120 mg by mouth 3 (three) times daily as needed. For a-fib    . medroxyPROGESTERone (DEPO-PROVERA) 150 MG/ML injection Inject 150 mg into the muscle every 3 (three) months.      Allergies: Allergies as of 06/03/2015 - Review Complete 06/03/2015  Allergen Reaction Noted  . Morphine and related Rash 12/11/2014   Past Medical History  Diagnosis Date  . SVT (supraventricular tachycardia)    Past Surgical History  Procedure Laterality Date  . Cardiac electrophysiology study and ablation    . Fracture surgery Left     Arm   No family history on file. Social History   Social History  . Marital Status: Single     Spouse Name: N/A  . Number of Children: N/A  . Years of Education: N/A   Occupational History  . Not on file.   Social History Main Topics  . Smoking status: Current Every Day Smoker  . Smokeless tobacco: Not on file  . Alcohol Use: Yes     Comment: social  . Drug Use: No  . Sexual Activity: Not on file   Other Topics Concern  . Not on file   Social History Narrative    Review of Systems: CONSTITUTIONAL- No Fever, change in appetite. SKIN- No Rash, colour changes or itching. HEAD- Has occasional Headache relieved with excedrin or dizziness. RESPIRATORY- No Cough or SOB. CARDIAC- No Palpitations or chest pain. GI- Pt has occasional diarrhea and has been diagnosed with IBS.  Physical Exam: Blood pressure 126/94, pulse 116, temperature 98.7 F (37.1 C), temperature source Oral, resp. rate 16, SpO2 99 %. GENERAL- alert, co-operative, appears as stated age, not in any distress, father at bedside. HEENT- Atraumatic, normocephalic, PERRL, EOMI, oral mucosa appears moist CARDIAC- Tachycardic, but regular, no murmurs, rubs or gallops. RESP- Moving equal volumes of air, and clear to auscultation bilaterally, no wheezes or crackles. ABDOMEN- Soft, nontender, no palpable masses or organomegaly, bowel sounds present. BACK- Normal curvature of the spine, No tenderness along the vertebrae, no CVA tenderness. NEURO- No obvious Cr N abnormality, strenght upper and lower extremities- 5/5,  EXTREMITIES- pulse 2+, symmetric, no pedal edema. SKIN- Warm, dry, No rash or lesion. PSYCH- Normal mood and affect, appropriate thought content and speech  Lab results: Basic Metabolic Panel:  Recent Labs  09/81/19 2036 06/03/15 0304  NA 139 141  K 4.1 3.5  CL 105 110  CO2 22  --   GLUCOSE 110* 97  BUN 12 12  CREATININE 0.97 0.80  CALCIUM 9.9  --    CBC:  Recent Labs  06/02/15 2036 06/03/15 0304  WBC 11.2*  --   HGB 15.6* 15.0  HCT 44.7 44.0  MCV 84.3  --   PLT 285  --     D-Dimer:  Recent Labs  06/03/15 0257  DDIMER <0.27   Thyroid Function Tests:  Recent Labs  06/03/15 0257  TSH 3.658   Urine Drug Screen: Drugs of Abuse     Component Value Date/Time   LABOPIA NONE DETECTED 06/03/2015 0544   COCAINSCRNUR NONE DETECTED 06/03/2015 0544   LABBENZ NONE DETECTED 06/03/2015 0544   AMPHETMU NONE DETECTED 06/03/2015 0544   THCU NONE DETECTED 06/03/2015 0544   LABBARB NONE DETECTED 06/03/2015 0544    Urinalysis:  Recent Labs  06/03/15 0544  COLORURINE STRAW*  LABSPEC 1.006  PHURINE 6.5  GLUCOSEU NEGATIVE  HGBUR NEGATIVE  BILIRUBINUR NEGATIVE  KETONESUR NEGATIVE  PROTEINUR NEGATIVE  UROBILINOGEN 0.2  NITRITE NEGATIVE  LEUKOCYTESUR NEGATIVE  Imaging results:  Dg Chest Portable 1 View  06/02/2015   CLINICAL DATA:  SVT.  Complains of centralized chest pain.  EXAM: PORTABLE CHEST - 1 VIEW  COMPARISON:  None.  FINDINGS: Cardiac pad overlies the left lower chest. Both lungs are clear. Negative for a pneumothorax. Heart size is normal. The trachea is midline.  IMPRESSION: No focal chest disease.   Electronically Signed   By: Richarda Overlie M.D.   On: 06/02/2015 22:00    Other results: ZOX:WRUE- 106bpm, regular, sinus rhythm, but EKG earlier on admission- rate 140s, appears to be SVT. No ST or T wave abnormalities.  Assessment & Plan by Problem: Active Problems:   Tachycardia   IBS (irritable bowel syndrome)   Tobacco abuse   Obesity  Tachycardia and Chest pain Heart rate- 140s, per EKG tracing appears to be SVT. Previous hx of SVT, atria tachycardia. Precipitants for this episode of chest pain unknown, UDS negative, she denies taking Pherntermine in at least 6 weeks. Tobacco use could be contributing. Electrolytes unremarkable. She does not appear dehydrated. Chest pain Likely due to increased cardiac demand due to tachycardia. Doubt real ischemia. Hgb stable-15.6. Will rule out pheochromocytoma with urine fractionated cathecolamines. - Admit  to tele - TSH pending - Check Mag - I stat trop neg. Trop X1- considering chest pain - Was given fentanyl for chest pain ( allergy to morphine without response, so 1mg  IV dilaudid given) - Consult EP, has already had extensive work up in the past- Per records in Care everywhere. - Consider increased dose of cardizem, blood pressure can tolerate or adding metop, pt needs to resume care with her cardiologist at [facility], and if these are not effective consider other options like antiarrhythmics. - 24 hour urinary fractionated cathecholamines- as index of suspicion is low.  Tobacco abuse- Counselled patient on quitting. She sayshe has tried everything, From patches, to gum, chantix, hypnotherapy. - Nicotine patch  Dispo: Disposition is deferred at this time, awaiting improvement of current medical problems. Anticipated discharge in approximately 1-2 day(s).   The patient does have a current PCP (No primary care provider on file.) and does need an [facility] hospital follow-up appointment after discharge.  The patient does not have transportation limitations that hinder transportation to clinic appointments.  Signed: Onnie Boer, MD 06/03/2015, 8:45 AM

## 2015-06-03 NOTE — ED Notes (Signed)
Internal Medicine at bedside.  

## 2015-06-03 NOTE — ED Notes (Signed)
Patient now sitting in chair.

## 2015-06-04 DIAGNOSIS — F17210 Nicotine dependence, cigarettes, uncomplicated: Secondary | ICD-10-CM

## 2015-06-04 DIAGNOSIS — R079 Chest pain, unspecified: Secondary | ICD-10-CM

## 2015-06-04 DIAGNOSIS — R000 Tachycardia, unspecified: Secondary | ICD-10-CM | POA: Diagnosis not present

## 2015-06-04 MED ORDER — METOPROLOL TARTRATE 25 MG PO TABS: 25.0000 mg | ORAL_TABLET | Freq: Two times a day (BID) | ORAL | Status: DC

## 2015-06-04 MED ORDER — IVABRADINE HCL 5 MG PO TABS: 5.0000 mg | ORAL_TABLET | Freq: Two times a day (BID) | ORAL | Status: DC

## 2015-06-04 NOTE — Progress Notes (Signed)
Subjective: Kristy Hubbard was evaluated this morning on rounds.  She states feeling better today denying SOB, chest pain or tachycardia at this time.  She confirms that Cardiology came and recommended stopping Diltiazem and starting a low dose of Lopressor.  She was counseled on the importance of caffeine and tobacco restriction.  She states she will not quit smoking but will try to reduce caffeine intake.  She also states that she has quit taking Phentermine and was encouraged not to restart.      Objective: Vital signs in last 24 hours: Filed Vitals:   06/03/15 1700 06/03/15 2147 06/04/15 0148 06/04/15 0533  BP: 116/84  106/60 93/56  Pulse: 116 61 76 85  Temp: 97 F (36.1 C) 98.5 F (36.9 C) 98.7 F (37.1 C) 98.3 F (36.8 C)  TempSrc: Oral Oral Oral Oral  Resp: Height:  (1.499 m)     Weight:    62.8 kg (138 lb 7.2 oz)  SpO2: 100% 100% 100% 97%   Weight change:   Intake/Output Summary (Last 24 hours) at 06/04/15 1121 Last data filed at 06/04/15 0100  Gross per 24 hour  Intake      0 ml  Output    200 ml  Net   -200 ml   General: resting in bed HEENT: PERRL, EOMI, no scleral icterus Cardiac: RRR, no rubs, murmurs or gallops Pulm: clear to auscultation bilaterally, moving normal volumes of air Abd: soft, nontender, nondistended, BS present Ext: warm and well perfused, no pedal edema Neuro: alert and oriented X3  Lab Results: Lab Results  Component Value Date   WBC 11.2* 06/02/2015   HGB 15.0 06/03/2015   HCT 44.0 06/03/2015   MCV 84.3 06/02/2015   PLT 285 06/02/2015   BMET    Component Value Date/Time   NA 141 06/03/2015 0304   K 3.5 06/03/2015 0304   CL 110 06/03/2015 0304   CO2 22 06/02/2015 2036   GLUCOSE 97 06/03/2015 0304   BUN 12 06/03/2015 0304   CREATININE 0.80 06/03/2015 0304   CALCIUM 9.9 06/02/2015 2036   GFRNONAA >60 06/02/2015 2036   GFRAA >60 06/02/2015 2036     Micro Results: No results found for this or any previous  visit (from the past 240 hour(s)). Studies/Results: Dg Chest Portable 1 View  06/02/2015   CLINICAL DATA:  SVT.  Complains of centralized chest pain.  EXAM: PORTABLE CHEST - 1 VIEW  COMPARISON:  None.  FINDINGS: Cardiac pad overlies the left lower chest. Both lungs are clear. Negative for a pneumothorax. Heart size is normal. The trachea is midline.  IMPRESSION: No focal chest disease.   Electronically Signed   By: Richarda Overlie M.D.   On: 06/02/2015 22:00   Medications: I have reviewed the patient's current medications. Scheduled Meds: . enoxaparin (LOVENOX) injection  40 mg Subcutaneous Q24H  . ivabradine  5 mg Oral BID WC  . metoprolol tartrate  25 mg Oral BID  . nicotine  14 mg Transdermal Daily   Continuous Infusions:  PRN Meds:.acetaminophen, diphenhydrAMINE Assessment/Plan: Principal Problem:   Tachycardia:  Patient's HR during admission was 90-131.  Patient complained of chest pain yesterday and was given Tylenol PRN.  Cardiology was consulted and patient appears to have sinus tachy without SVTs.  Medication changes were recommended. - Anticipate discharge as patient has improved clinically and vitals are stable    - Stop Diltiazem and start Lopressor  per Cardio's recommendation - Ivabradine  was  started on admission and continued use should be discussed with Cardio on follow-up. - Follow-up with Cardiology on discharge  Active Problems:    Tobacco abuse:  Patient is a 1/2 - 1 pack per day smoker.  She states she has tried quitting in the past but there hasn't been a method that has been successful and she does not currently plan to quit .  Patient was given a nicotine patch during stay.   - Encouraged tobacco and caffeine restriction. - Follow up with PCP in one week after discharge.        This is a Psychologist, occupational Note.  The care of the patient was discussed with Dr. Andrey Campanile and the assessment and plan formulated with their assistance.  Please see their attached note for  official documentation of the daily encounter.     Camelia Phenes, Med Student 06/04/2015, 11:21 AM

## 2015-06-04 NOTE — Discharge Summary (Signed)
Name: Kristy Hubbard MRN: [id number] DOB: [birth year] 24 y.o. PCP: No primary care provider on file.  Date of Admission: 06/03/2015  1:46 AM Date of Discharge: 06/04/2015 Attending Physician: Tyson Alias, MD  Discharge Diagnosis: Sinus tachycardia and chest pain Tobacco/stimulant use  Discharge Medications:   Medication List    STOP taking these medications        diltiazem 120 MG tablet  Commonly known as:  CARDIZEM      TAKE these medications        ivabradine 5 MG Tabs tablet  Commonly known as:  CORLANOR  Take 1 tablet (5 mg total) by mouth 2 (two) times daily with a meal.     medroxyPROGESTERone 150 MG/ML injection  Commonly known as:  DEPO-PROVERA  Inject 150 mg into the muscle every 3 (three) months.     metoprolol tartrate 25 MG tablet  Commonly known as:  LOPRESSOR  Take 1 tablet (25 mg total) by mouth 2 (two) times daily.        Disposition and follow-up:   Kristy Hubbard was discharged from [facility] in Good condition.  At the hospital follow up visit please address:  1.  Assess for symptom control on metoprolol.  Has she obtained Ivabradine?  2.  Labs / imaging needed at time of follow-up: none  3.  Pending labs/ test needing follow-up: none   Consultations:  Cardiology  Procedures Performed:  Dg Chest Portable 1 View  06/02/2015   CLINICAL DATA:  SVT.  Complains of centralized chest pain.  EXAM: PORTABLE CHEST - 1 VIEW  COMPARISON:  None.  FINDINGS: Cardiac pad overlies the left lower chest. Both lungs are clear. Negative for a pneumothorax. Heart size is normal. The trachea is midline.  IMPRESSION: No focal chest disease.   Electronically Signed   By: Richarda Overlie M.D.   On: 06/02/2015 22:00    Admission HPI: 1 Y O F with PMH of SVT, Tobacco abuse, IBS, obesity presented with complaints of racing heart rate and chest pain that started yesterday while she was watching Tv. Chest pain is mostly in the center of  her chest, associated with diaphoresis and SOB, with mild nausea. She also endorses subsequent increased heart rate. She denies use of illicit drugs, has had several episodes of exact same/similar episodes in the past- Chest pain with increased heart rates. She took 4 tablets of Cardizem- She thinks its is 120mg  strenght tablet. This has previously helped with her palpitations, but this time this did not help as much. She has rapid heart rates at baseline, she is not able to take part in strenous activity as she says she feels she will pass out. Last time she had this palpitations severe enough to warrant Ed visit was about 3 months ago she says, she cannot remember the name of the hospital in [place]. Pt is adopted so family hx of cardiac arrhythmias is unknown. Pt has also been on phetermine for weight loss, but says she sold her last bottle of pills to a friend she has nmot taken this medication in at least 3 weeks. She also smokes cigs- started in her early teens and smokes 1/2 a pack to one pack per day.  She has an extensive history documented in care everywhere. She has been diagnosed with SVT, had EP studies in 2010, which was unremarkable, pt thinks he might have had an ablation, but this is not documented also with negative EP study this is unlikely.  She says she has also had stress testing done in the past. She stays in [place] now. He cardiologists are in [facility]. It appears she was diagnosed when she was 24 yrs old, she was given a holter monitor to wear. She last saw them in 2-3 years ago, per chart 2012. She says she has not been good about keeping her appointments. Per notes from her cardiogist patient is supposed to be taking  of Cardizem PRN. Her heart rates at her clinic visits are always elevated in the 110s. She presented here today as she says she did not want to take the long drive to [place]. She stays in [place] now.  Pt presented earlier yesterday evening, with  similar complaints, with heart rate up to 140s, vagal maneuvers and adenosine were given but heart rate responded to cardizem  and metop  , she came back again, later with chest pain, and increased heart rate, was given ativan, she was given Cardizem and metop, with some response but she still remained slightly tachycardic.    Hospital Course by problem list: Sinus tachycardia and chest pain:  The patient has a history of SVT and presented with HR into the 140s.  Her EKG was non-ischemic and troponin negative.  She is low risk for ACS given young age and chest pain was likely due to tachycardia.  Cardiology felt this was sinus tachycardia, possibly POTS vs inappropriate sinus tachycardia.  Cardizem was changed to Lopressor to avoid vasodilation.  Cardiology also started Ivabradine , which is apparently used for inappropriate ST in other countries but this would be off-label use in the [place].  She agreed to try Ivabradine in hospital but due to the expense may not be able to continue after discharge.  She was encouraged to discuss the use of ivabradine when she follows up with her cardiologist since it will likely require prior authorization.  The patient was feeling well by hospital day 2, denies CP or dyspnea, and HR was normal.  She was discharged in good condition and will follow-up with PCP, her cardiologist and EP after discharge.  Tabacco/stimulant use:  Patient drinks caffeine daily, smokes 1/2 -1 pack daily and had been taking Phentermine for the past 5 years but stopped 3 weeks ago.  Patient was counseled on the importance of caffeine and tobacco cessation but says she did not feel she could quit smoking.  She was also encouraged to not resume Phentermine.  She was counseled on how these stimulants can worsen her ST and may not allow her the full benefit from her medication.  She was encouraged to explore tobacco cessation therapies with her PCP.  She will follow up with her PCP in one week.       Discharge Vitals:   BP 93/56 mmHg  Pulse 85  Temp(Src) 98.3 F (36.8 C) (Oral)  Resp 20  Ht  (1.499 m)  Wt 62.8 kg (138 lb 7.2 oz)  BMI 27.95 kg/m2  SpO2 97%  Discharge Labs:  Results for orders placed or performed during the hospital encounter of 06/03/15 (from the past 24 hour(s))  Magnesium     Status: None   Collection Time: 06/03/15  2:15 PM  Result Value Ref Range   Magnesium 2.2 1.7 - 2.4 mg/dL  Troponin I     Status: None   Collection Time: 06/03/15  2:15 PM  Result Value Ref Range   Troponin I <0.03 <0.031 ng/mL    Signed: Evelena Peat, DO  06/04/2015,  10:11 AM    Services Ordered on Discharge: none Equipment Ordered on Discharge: none

## 2015-06-04 NOTE — Discharge Instructions (Addendum)
1. Please follow-up with your primary doctor within 1 week.  Also, follow-up with your Cardiologist and EP Cardiologist, Dr. Graciela Husbands as soon as possible. Please stop any diet pills, tobacco use, excess caffeine and avoid pseudoephedrine, other stimulants that increase your heart rate.      2. Please take all medications as prescribed.  STOP Cardizem.  START metoprolol as prescribed.  Your primary doctor or Cardiologist can work on increasing this medication if they feel it is necessary.   3. If you have worsening of your symptoms or new symptoms arise, please call the clinic ([phone]), or go to the ER immediately if symptoms are severe.   Metoprolol tablets What is this medicine? METOPROLOL (me TOE proe lole) is a beta-blocker. Beta-blockers reduce the workload on the heart and help it to beat more regularly. This medicine is used to treat high blood pressure and to prevent chest pain. It is also used to after a heart attack and to prevent an additional heart attack from occurring. This medicine may be used for other purposes; ask your health care provider or pharmacist if you have questions. COMMON BRAND NAME(S): Lopressor What should I tell my health care provider before I take this medicine? They need to know if you have any of these conditions: -diabetes -heart or vessel disease like slow heart rate, worsening heart failure, heart block, sick sinus syndrome or Raynaud's disease -kidney disease -liver disease -lung or breathing disease, like asthma or emphysema -pheochromocytoma -thyroid disease -an unusual or allergic reaction to metoprolol, other beta-blockers, medicines, foods, dyes, or preservatives -pregnant or trying to get pregnant -breast-feeding How should I use this medicine? Take this medicine by mouth with a drink of water. Follow the directions on the prescription label. Take this medicine immediately after meals. Take your doses at regular intervals. Do not take more  medicine than directed. Do not stop taking this medicine suddenly. This could lead to serious heart-related effects. Talk to your pediatrician regarding the use of this medicine in children. Special care may be needed. Overdosage: If you think you have taken too much of this medicine contact a poison control center or emergency room at once. NOTE: This medicine is only for you. Do not share this medicine with others. What if I miss a dose? If you miss a dose, take it as soon as you can. If it is almost time for your next dose, take only that dose. Do not take double or extra doses. What may interact with this medicine? This medicine may interact with the following medications: -certain medicines for blood pressure, heart disease, irregular heart beat -certain medicines for depression like monoamine oxidase (MAO) inhibitors, fluoxetine, or paroxetine -clonidine -dobutamine -epinephrine -isoproterenol -reserpine This list may not describe all possible interactions. Give your health care provider a list of all the medicines, herbs, non-prescription drugs, or dietary supplements you use. Also tell them if you smoke, drink alcohol, or use illegal drugs. Some items may interact with your medicine. What should I watch for while using this medicine? Visit your doctor or health care professional for regular check ups. Contact your doctor right away if your symptoms worsen. Check your blood pressure and pulse rate regularly. Ask your health care professional what your blood pressure and pulse rate should be, and when you should contact them. You may get drowsy or dizzy. Do not drive, use machinery, or do anything that needs mental alertness until you know how this medicine affects you. Do not sit or stand up  quickly, especially if you are an older patient. This reduces the risk of dizzy or fainting spells. Contact your doctor if these symptoms continue. Alcohol may interfere with the effect of this medicine.  Avoid alcoholic drinks. What side effects may I notice from receiving this medicine? Side effects that you should report to your doctor or health care professional as soon as possible: -allergic reactions like skin rash, itching or hives -cold or numb hands or feet -depression -difficulty breathing -faint -fever with sore throat -irregular heartbeat, chest pain -rapid weight gain -swollen legs or ankles Side effects that usually do not require medical attention (report to your doctor or health care professional if they continue or are bothersome): -anxiety or nervousness -change in sex drive or performance -dry skin -headache -nightmares or trouble sleeping -short term memory loss -stomach upset or diarrhea -unusually tired This list may not describe all possible side effects. Call your doctor for medical advice about side effects. You may report side effects to FDA at 1-800-FDA-1088. Where should I keep my medicine? Keep out of the reach of children. Store at room temperature between 15 and 30 degrees C (59 and 86 degrees F). Throw away any unused medicine after the expiration date. NOTE: This sheet is a summary. It may not cover all possible information. If you have questions about this medicine, talk to your doctor, pharmacist, or health care provider.  2015, [employer]vier/Gold Standard. (2013-06-05 14:40:36)  Ivabradine tablets What is this medicine? IVABRADINE (eye VAB ra deen) is used to reduce the risk of going to the hospital due to worsening heart failure. This medicine may be used for other purposes; ask your health care provider or pharmacist if you have questions. COMMON BRAND NAME(S): Corlanor What should I tell my health care provider before I take this medicine? They need to know if you have any of these conditions: -certain heart conditions like sick sinus syndrome, sinoatrial block, or third degree atrioventricular block -heart failure that has recently  worsened -liver disease -low blood pressure (less than 90/50 mmHg) -low resting heart rate (less than 60 beats per minute) -pacemaker -an unusual or allergic reaction to ivabradine, other medicines, foods, dyes, or preservatives -pregnant or trying to get pregnant -breast-feeding How should I use this medicine? Take this medicine by mouth with a glass of water. Follow the directions on the prescription label. Avoid grapefruit juice. Take your medicine at regular intervals. Do not take it more often than directed. Do not stop taking except on your doctor's advice. Talk to your pediatrician regarding the use of this medicine in children. Special care may be needed. Overdosage: If you think you've taken too much of this medicine contact a poison control center or emergency room at once. Overdosage: If you think you have taken too much of this medicine contact a poison control center or emergency room at once. NOTE: This medicine is only for you. Do not share this medicine with others. What if I miss a dose? If you miss a dose, take it as soon as you can. If it is almost time for your next dose, take only that dose. Do not take double or extra doses. What may interact with this medicine? Do not take this medicine with any of the following medications: -certain medicines for fungal infections like ketoconazole and itraconazole -certain antibiotics called macrolide antibiotics like clarithromycin and telithromycin -certain medicines for HIV disease called protease inhibitors like nelfinavir -nefazodone This medicine may also interact with the following medications: -amiodarone -beta-blockers like  metoprolol and propranolol -calcium channel blockers like diltiazem and verapamil -certain medicines for seizures like barbiturates and phenytoin -digoxin -grapefruit juice -rifampicin -St. John's wort This list may not describe all possible interactions. Give your health care provider a list of all  the medicines, herbs, non-prescription drugs, or dietary supplements you use. Also tell them if you smoke, drink alcohol, or use illegal drugs. Some items may interact with your medicine. What should I watch for while using this medicine? Tell your doctor or health care professional if your symptoms do not start to get better or if they get worse. Tell your doctor or health care professional right away if you have any changes in your eyesight. Do not become pregnant while taking this medicine. Women who are able to get pregnant must use birth control while taking this medicine. Women should inform their doctor if they wish to become pregnant or think they might be pregnant. There is a potential for serious side effects to an unborn child. Talk to your health care professional or pharmacist for more information. What side effects may I notice from receiving this medicine? Side effects that you should report to your doctor or health care professional as soon as possible: -allergic reactions like skin rash, itching or hives, swelling of the face, lips, or tongue -high blood pressure -signs and symptoms of a dangerous change in heartbeat or heart rhythm like chest pain; dizziness; fast or irregular heartbeat; palpitations; feeling faint or lightheaded; breathing problems -unusually slow heartbeat Side effects that usually do not require medical attention (Report these to your doctor or health care professional if they continue or are bothersome.): -changes in vision This list may not describe all possible side effects. Call your doctor for medical advice about side effects. You may report side effects to FDA at 1-800-FDA-1088. Where should I keep my medicine? Keep out of the reach of children. Store at room temperature between 20 and 25 degrees C (68 and 77 degrees F). Throw away any unused medicine after the expiration date. NOTE: This sheet is a summary. It may not cover all possible information. If  you have questions about this medicine, talk to your doctor, pharmacist, or health care provider.  2015, [employer]vier/Gold Standard. (2014-02-03 15:21:41)

## 2015-06-04 NOTE — Progress Notes (Signed)
Pt is being discharged home. Discharge instructions were given to patient and family. Ivabradine 5 mg was prescribed and patient is concerned about the cost. Case manager was paged and stated there is nothing she could do because she had insurance. Patient was made aware and said she will follow up with her insurance and her Dr. If cost is too high then her Dr will change it.

## 2015-06-04 NOTE — Progress Notes (Signed)
   Subjective: Kristy Hubbard was seen and examined this AM.  She feels well this AM, no more CP, breathing well, appetite good.  Her mother says she will not be able to afford ivabradine if not covered by insurance.   Objective: Vital signs in last 24 hours: Filed Vitals:   06/03/15 1700 06/03/15 2147 06/04/15 0148 06/04/15 0533  BP: 116/84  106/60 93/56  Pulse: 116 61 76 85  Temp: 97 F (36.1 C) 98.5 F (36.9 C) 98.7 F (37.1 C) 98.3 F (36.8 C)  TempSrc: Oral Oral Oral Oral  Resp: Height:  (1.499 m)     Weight:    138 lb 7.2 oz (62.8 kg)  SpO2: 100% 100% 100% 97%   Weight change:   Intake/Output Summary (Last 24 hours) at 06/04/15 1110 Last data filed at 06/04/15 0100  Gross per 24 hour  Intake      0 ml  Output    200 ml  Net   -200 ml   General: resting in bed in NAD HEENT: [place]/AT Cardiac: RRR, no rubs, murmurs or gallops Pulm: clear to auscultation bilaterally, moving normal volumes of air Abd: soft, nontender, nondistended, BS present Ext: warm and well perfused, no pedal edema Neuro: alert and oriented X3, MAE spontaneously, responding appropriately  Medications: I have reviewed the patient's current medications. Scheduled Meds: . enoxaparin (LOVENOX) injection  40 mg Subcutaneous Q24H  . ivabradine  5 mg Oral BID WC  . metoprolol tartrate  25 mg Oral BID  . nicotine  14 mg Transdermal Daily   Continuous Infusions: none PRN Meds:.acetaminophen, diphenhydrAMINE   Assessment/Plan: 24 year old with hx of SVT here with tachycardia.  Tachycardia:  Hx of SVT here with tachycardia into the 130s, chest pain.  D-dimer negative, no sign of infection, TSH normal.  Appreciate cardiology recommendations and interventions.  Appears to be sinus tachycardia; possibly POTS vs inappropriate sinus tach.  Cardizem was switched to metoprolol to avoid vasodilatory effects.  HRs well controlled this AM and patient asymptomatic.   - discharge home - continue  metoprolol at discharge and can be titrated up; patient instructed to STOP Cardizem - provided with rx for ivabradine, however patient will discuss continuing this med with her cardiologist if she is able to obtain it and finds it beneficial - advised to stop any diet pills, tobacco use, excess caffeine and avoid pseudoephedrine, other stimulants - she will follow-up with PCP, Cardiology (Dr. Vilinda Boehringer in [place]) and Cards EP (Dr. Graciela Husbands)  Tobacco abuse:  Counseled to abstain.  Patient says she will not be quitting in the near future.  She was encouraged to discuss options for cessation with her PCP.  Dispo: Her HR is well controlled and she is stable for d/c home with close PCP, Cardiology and EP follow-up.  The patient does have a current PCP (No primary care provider on file.) and does not need an [facility] hospital follow-up appointment after discharge.  The patient does not know have transportation limitations that hinder transportation to clinic appointments.  .Services Needed at time of discharge: Y = Yes, Blank = No PT:   OT:   RN:   Equipment:   Other:       Yolanda Manges, DO 06/04/2015, 11:10 AM

## 2016-01-01 IMAGING — CR DG CHEST 1V PORT
1 series · 1 of 1 positions shown · non-contrast
Comparison: None.

CLINICAL DATA: SVT.  Complains of centralized chest pain.

EXAM:
PORTABLE CHEST - 1 VIEW

[AP]
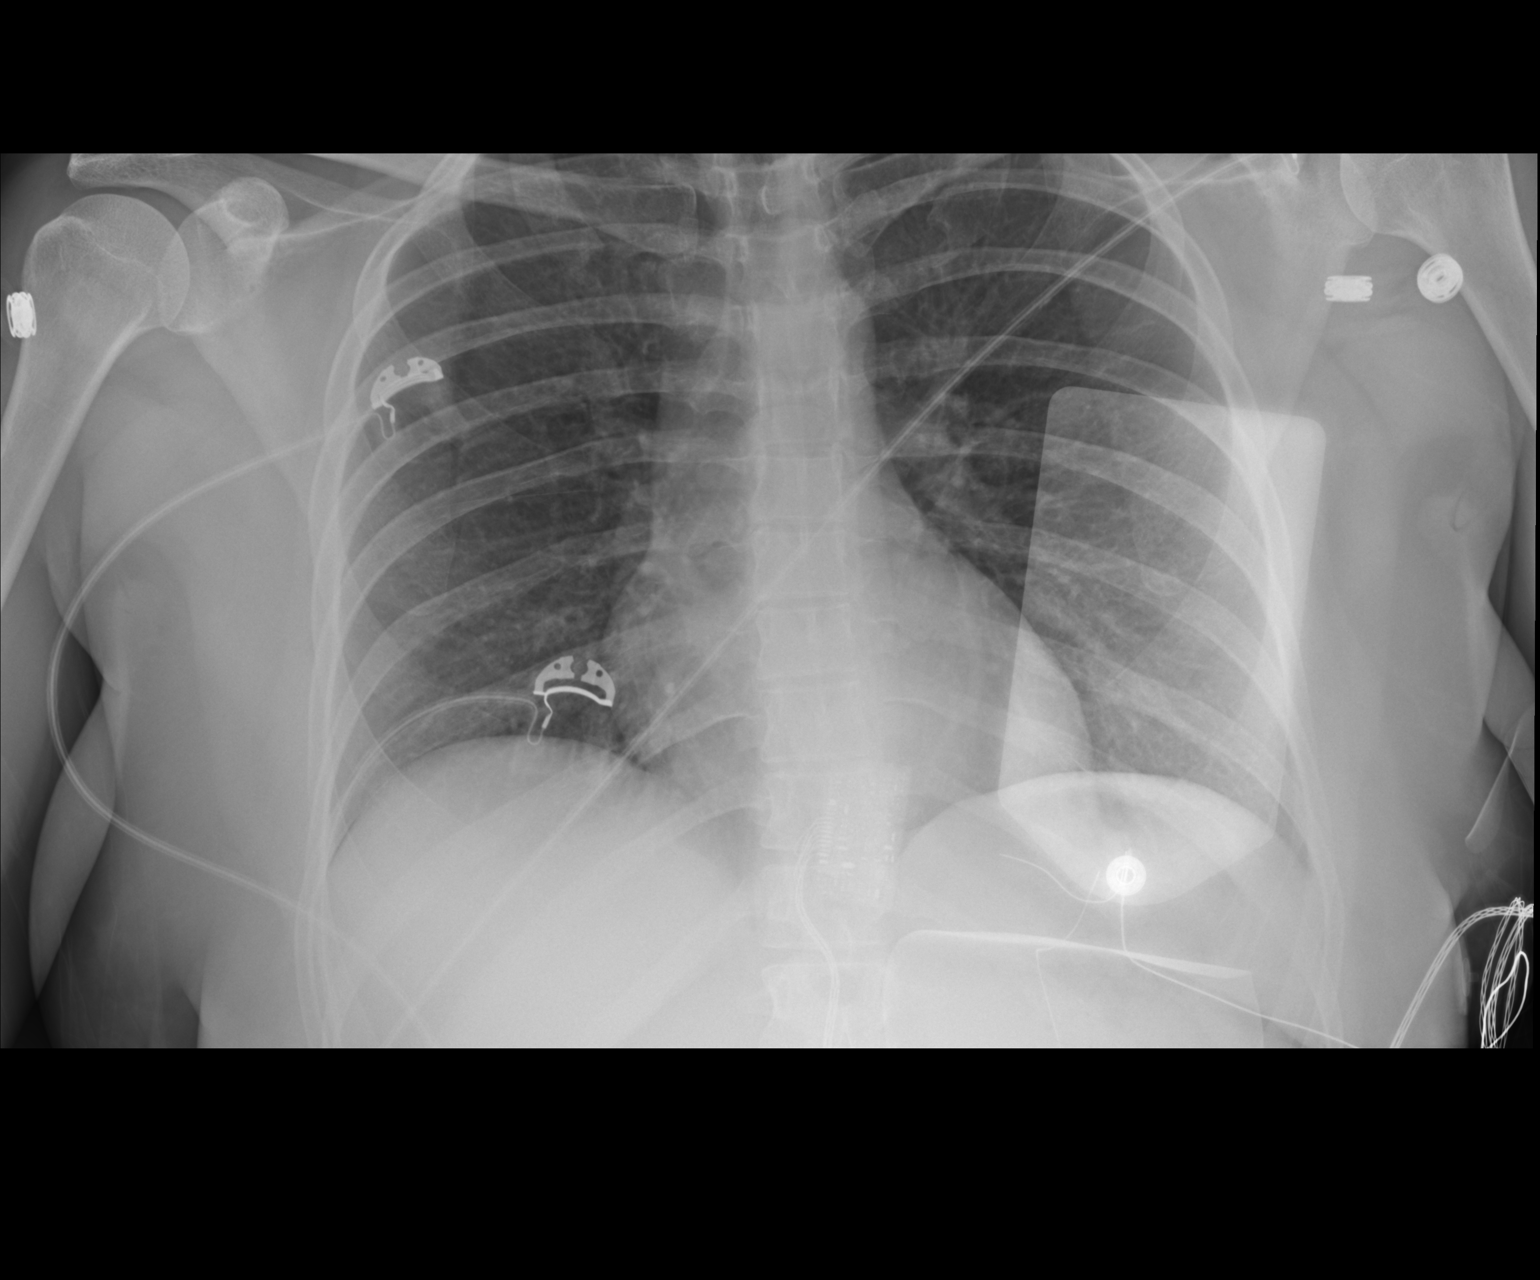

[1 of 1 positions shown; findings below may reference images not displayed]

FINDINGS: Cardiac pad overlies the left lower chest. Both lungs are clear.
Negative for a pneumothorax. Heart size is normal. The trachea is
midline.
IMPRESSION: No focal chest disease.

## 2023-10-30 ENCOUNTER — Other Ambulatory Visit: Payer: Self-pay

## 2023-10-30 ENCOUNTER — Emergency Department (HOSPITAL_COMMUNITY): Payer: Medicaid Other

## 2023-10-30 ENCOUNTER — Encounter (HOSPITAL_COMMUNITY): Payer: Self-pay

## 2023-10-30 ENCOUNTER — Ambulatory Visit: Payer: Medicaid Other | Admitting: Neurology

## 2023-10-30 ENCOUNTER — Encounter: Payer: Self-pay | Admitting: Neurology

## 2023-10-30 ENCOUNTER — Emergency Department (HOSPITAL_COMMUNITY)
Admission: EM | Admit: 2023-10-30 | Discharge: 2023-10-30 | Discharge status: Against Medical Advice | Payer: Medicaid Other | Attending: Emergency Medicine | Admitting: Emergency Medicine

## 2023-10-30 VITALS — BP 168/115 | HR 102 | Ht 60.0 in | Wt 184.5 lb

## 2023-10-30 DIAGNOSIS — H4710 Unspecified papilledema: Secondary | ICD-10-CM | POA: Diagnosis not present

## 2023-10-30 DIAGNOSIS — R519 Headache, unspecified: Secondary | ICD-10-CM | POA: Diagnosis present

## 2023-10-30 DIAGNOSIS — G8929 Other chronic pain: Secondary | ICD-10-CM

## 2023-10-30 DIAGNOSIS — Z5321 Procedure and treatment not carried out due to patient leaving prior to being seen by health care provider: Secondary | ICD-10-CM | POA: Diagnosis not present

## 2023-10-30 DIAGNOSIS — R079 Chest pain, unspecified: Secondary | ICD-10-CM | POA: Insufficient documentation

## 2023-10-30 DIAGNOSIS — I160 Hypertensive urgency: Secondary | ICD-10-CM

## 2023-10-30 DIAGNOSIS — Z86718 Personal history of other venous thrombosis and embolism: Secondary | ICD-10-CM | POA: Diagnosis not present

## 2023-10-30 DIAGNOSIS — Z86711 Personal history of pulmonary embolism: Secondary | ICD-10-CM

## 2023-10-30 DIAGNOSIS — R0602 Shortness of breath: Secondary | ICD-10-CM | POA: Insufficient documentation

## 2023-10-30 DIAGNOSIS — Z8759 Personal history of other complications of pregnancy, childbirth and the puerperium: Secondary | ICD-10-CM

## 2023-10-30 LAB — CBC WITH DIFFERENTIAL/PLATELET
Abs Immature Granulocytes: 0.03 10*3/uL (ref 0.00–0.07)
Basophils Absolute: 0.1 10*3/uL (ref 0.0–0.1)
Basophils Relative: 1 %
Eosinophils Absolute: 0.2 10*3/uL (ref 0.0–0.5)
Eosinophils Relative: 2 %
HCT: 47.9 % — ABNORMAL HIGH (ref 36.0–46.0)
Hemoglobin: 15.3 g/dL — ABNORMAL HIGH (ref 12.0–15.0)
Immature Granulocytes: 0 %
Lymphocytes Relative: 31 %
Lymphs Abs: 4 10*3/uL (ref 0.7–4.0)
MCH: 27.4 pg (ref 26.0–34.0)
MCHC: 31.9 g/dL (ref 30.0–36.0)
MCV: 85.8 fL (ref 80.0–100.0)
Monocytes Absolute: 0.6 10*3/uL (ref 0.1–1.0)
Monocytes Relative: 5 %
Neutro Abs: 8.1 10*3/uL — ABNORMAL HIGH (ref 1.7–7.7)
Neutrophils Relative %: 61 %
Platelets: 362 10*3/uL (ref 150–400)
RBC: 5.58 MIL/uL — ABNORMAL HIGH (ref 3.87–5.11)
RDW: 13.5 % (ref 11.5–15.5)
WBC: 12.9 10*3/uL — ABNORMAL HIGH (ref 4.0–10.5)
nRBC: 0 % (ref 0.0–0.2)

## 2023-10-30 LAB — I-STAT CHEM 8, ED
BUN: 8 mg/dL (ref 6–20)
Calcium, Ion: 1.17 mmol/L (ref 1.15–1.40)
Chloride: 106 mmol/L (ref 98–111)
Creatinine, Ser: 0.7 mg/dL (ref 0.44–1.00)
Glucose, Bld: 82 mg/dL (ref 70–99)
HCT: 47 % — ABNORMAL HIGH (ref 36.0–46.0)
Hemoglobin: 16 g/dL — ABNORMAL HIGH (ref 12.0–15.0)
Potassium: 4.3 mmol/L (ref 3.5–5.1)
Sodium: 139 mmol/L (ref 135–145)
TCO2: 23 mmol/L (ref 22–32)

## 2023-10-30 LAB — BASIC METABOLIC PANEL
Anion gap: 14 (ref 5–15)
BUN: 8 mg/dL (ref 6–20)
CO2: 20 mmol/L — ABNORMAL LOW (ref 22–32)
Calcium: 9.8 mg/dL (ref 8.9–10.3)
Chloride: 103 mmol/L (ref 98–111)
Creatinine, Ser: 0.75 mg/dL (ref 0.44–1.00)
GFR, Estimated: >60 mL/min (ref >60)
Glucose, Bld: 86 mg/dL (ref 70–99)
Potassium: 4.1 mmol/L (ref 3.5–5.1)
Sodium: 137 mmol/L (ref 135–145)

## 2023-10-30 LAB — TROPONIN I (HIGH SENSITIVITY): Troponin I (High Sensitivity): 4 ng/L (ref <18)

## 2023-10-30 LAB — TSH: TSH: 2.755 u[IU]/mL (ref 0.350–4.500)

## 2023-10-30 LAB — HCG, SERUM, QUALITATIVE: Preg, Serum: NEGATIVE

## 2023-10-30 MED ORDER — IOHEXOL 350 MG/ML SOLN
75.0000 mL | Freq: Once | INTRAVENOUS | Status: AC | PRN
  Administered 2023-10-30: 75 mL via INTRAVENOUS

## 2023-10-30 MED ORDER — TRAMADOL HCL 50 MG PO TABS: 50.0000 mg | ORAL_TABLET | Freq: Four times a day (QID) | ORAL | 1 refills | Status: AC | PRN

## 2023-10-30 MED ORDER — GADOBUTROL 1 MMOL/ML IV SOLN
8.0000 mL | Freq: Once | INTRAVENOUS | Status: AC | PRN
  Administered 2023-10-30: 8 mL via INTRAVENOUS

## 2023-10-30 MED ORDER — SERTRALINE HCL 50 MG PO TABS: 50.0000 mg | ORAL_TABLET | Freq: Every day | ORAL | 3 refills | Status: AC

## 2023-10-30 NOTE — ED Provider Triage Note (Signed)
Emergency Medicine Provider Triage Evaluation Note  Kristy Hubbard , a 33 y.o. female  was evaluated in triage.  Pt complains of headache, chest pain shortness of breath, known history of pulmonary embolus.  She has been having the symptoms for about 2 months but progressively worsening, went into ophthalmology today and noted to have bilateral papilledema and symptoms of high intraocular pressure.  She was sent in for MRV to rule out blood clot in the brain.  Review of Systems  Positive: Headache and vision change Negative: Fever  Physical Exam  BP (!) 147/113 (BP Location: Left Arm)   Pulse (!) 126   Temp 98.6 F (37 C)   Resp 18   Ht 5' (1.524 m)   Wt 81.6 kg   SpO2 100%   BMI 35.15 kg/m  Gen:   Awake, no distress    Resp:  Normal effort   MSK:   Moves extremities without difficulty   Other:  ]  Medical Decision Making  Medically screening exam initiated at 5:34 PM.  Appropriate orders placed.  Kristy Hubbard was informed that the remainder of the evaluation will be completed by another provider, this initial triage assessment does not replace that evaluation, and the importance of remaining in the ED until their evaluation is complete.  Orders entered   Arthor Captain, [place] 10/30/23 1741

## 2023-10-30 NOTE — ED Triage Notes (Signed)
 Pt came to ED for chest pain that started yesterday and HTN. Hx of HTN. C/O headache. Denies weakness and blurred vision. Pt sees neuro for inflamed optic nerve.
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
## 2023-10-30 NOTE — ED Notes (Signed)
 Pt stated she will go to PCP for results. IV removed. Pt seen  leaving the ED

## 2023-10-31 ENCOUNTER — Other Ambulatory Visit: Payer: Self-pay | Admitting: Neurology

## 2023-10-31 DIAGNOSIS — G932 Benign intracranial hypertension: Secondary | ICD-10-CM

## 2023-10-31 DIAGNOSIS — H4710 Unspecified papilledema: Secondary | ICD-10-CM

## 2023-11-01 ENCOUNTER — Encounter: Payer: Self-pay | Admitting: Neurology

## 2023-11-04 ENCOUNTER — Telehealth: Payer: Self-pay

## 2023-11-04 NOTE — Telephone Encounter (Addendum)
Called pt and let her know Per Dr. Epimenio Foot  ----- Message from Asa Lente sent at 10/31/2023  1:13 PM EST ----- Regarding: ED visit    LP "Please let the patient know that I reviewed her emergency room notes.  It was good that she did not have any blood clot in the brain.    The pituitary gland is pushed down some which can happen when the pressure in the spinal fluid is elevated.      I have ordered a lumbar puncture so that we can measure how high your spinal fluid pressure is.  This will also relieve some of the pressure in the spinal fluid which will hopefully help the headaches.  If the fluid pressure is elevated, I will also start a medication."  Pt stated that she has her Lumbar Puncture already scheduled.

## 2023-11-08 NOTE — Discharge Instructions (Signed)
Lumbar Puncture Discharge Instructions  Go home and rest quietly as needed. You may resume normal activities; however, do not exert yourself strongly or do any heavy lifting today and tomorrow.   DO NOT drive today.    You may resume your normal diet and medications unless otherwise indicated. Drink lots of extra fluids today and tomorrow.   The incidence of headache, nausea, or vomiting is about 5% (one in 20 patients).  If you develop a headache, lie flat for 24 hours and drink plenty of fluids until the headache goes away.  Caffeinated beverages may be helpful. If when you get up you still have a headache when standing, go back to bed and force fluids for another 24 hours.   If you develop severe nausea and vomiting or a headache that does not go away with the flat bedrest after 48 hours, please call [phone].   Call your physician for a follow-up appointment.  The results of your Lumbar Puncture will be sent directly to your physician and they will contact you.   If you have any questions or if complications develop after you arrive home, please call [phone].  Discharge instructions have been explained to the patient.  The patient, or the person responsible for the patient, fully understands these instructions.   Thank you for visiting our office today.

## 2023-11-11 ENCOUNTER — Other Ambulatory Visit: Payer: Self-pay | Admitting: Neurology

## 2023-11-11 ENCOUNTER — Ambulatory Visit
Admission: RE | Admit: 2023-11-11 | Discharge: 2023-11-11 | Disposition: A | Discharge status: Home / Self Care | Payer: Medicaid Other | Source: Ambulatory Visit | Attending: Neurology | Admitting: Neurology

## 2023-11-11 ENCOUNTER — Telehealth: Payer: Self-pay | Admitting: *Deleted

## 2023-11-11 DIAGNOSIS — G932 Benign intracranial hypertension: Secondary | ICD-10-CM

## 2023-11-11 DIAGNOSIS — H4710 Unspecified papilledema: Secondary | ICD-10-CM

## 2023-11-11 LAB — CSF CELL COUNT WITH DIFFERENTIAL
RBC Count, CSF: 0 {cells}/uL
TOTAL NUCLEATED CELL: 1 {cells}/uL (ref 0–5)

## 2023-11-11 LAB — PROTEIN, CSF: Total Protein, CSF: 23 mg/dL (ref 15–45)

## 2023-11-11 LAB — GLUCOSE, CSF: Glucose, CSF: 61 mg/dL (ref 40–80)

## 2023-11-11 MED ORDER — ACETAZOLAMIDE ER 500 MG PO CP12: 500.0000 mg | ORAL_CAPSULE | Freq: Two times a day (BID) | ORAL | 11 refills | Status: AC

## 2023-11-11 MED ORDER — ACETAZOLAMIDE ER 500 MG PO CP12: 500.0000 mg | ORAL_CAPSULE | Freq: Two times a day (BID) | ORAL | 11 refills | Status: DC

## 2023-11-11 NOTE — Telephone Encounter (Signed)
Called pt and relayed results per Dr. Bonnita Hollow note. She verbalized understanding. Resent rx Diamox to [facility] - [place], [place] - [address]. Pt uses this pharmacy.  Scheduled 4-5 month f/u per MD request for 03/16/24 at 2:30pm.

## 2023-11-11 NOTE — Telephone Encounter (Signed)
-----   Message from Asa Lente sent at 11/11/2023 11:29 AM EST ----- The lumbar puncture showed that her spinal fluid pressure was elevated.  The CTs you had in the emergency room did not show any blood clots in the brain.  I would like to start Diamox and I sent in a prescription for this.  Please let her know.

## 2023-11-12 ENCOUNTER — Telehealth: Payer: Self-pay | Admitting: Neurology

## 2023-11-12 NOTE — Telephone Encounter (Signed)
Pt called to confirm acetaZOLAMIDE ER (DIAMOX) 500 MG capsule had been sent to the correct pharmacy. Checked list medication has been sent to correct.

## 2023-11-20 ENCOUNTER — Telehealth: Payer: Self-pay | Admitting: Neurology

## 2023-11-20 NOTE — Telephone Encounter (Signed)
 ER Carolynne Citron, RN at [facility] states Dr [phone] is asking for a call back from Dr Godwin Lat for a consult on this pt.

## 2023-12-02 ENCOUNTER — Telehealth: Payer: Self-pay | Admitting: Neurology

## 2023-12-02 ENCOUNTER — Other Ambulatory Visit: Payer: Self-pay | Admitting: Neurology

## 2023-12-02 MED ORDER — IMIPRAMINE HCL 25 MG PO TABS: 25.0000 mg | ORAL_TABLET | Freq: Every day | ORAL | 5 refills | Status: AC

## 2023-12-02 NOTE — Telephone Encounter (Signed)
 Pt called and LVM stating that provider informed her that if she is not feeling any better to call and let him know. She stated that she is not feeling better and would like to be advised.

## 2023-12-02 NOTE — Telephone Encounter (Signed)
 Called pt back. Relayed Dr. Bonnita Hollow recommendation. She verbalized understanding and is in agreement with plan. She will call back if this is ineffective for her.

## 2023-12-02 NOTE — Telephone Encounter (Signed)
 Called pharmacy back and spoke w/ Samdeep. Relayed Dr. Bonnita Hollow message. He verbalized understanding, nothing further needed.

## 2023-12-02 NOTE — Telephone Encounter (Signed)
 Called pt. Pt reports severe headaches/nausea that are daily but intermittent throughout the day. Nausea meds helps her sleep through the night but otherwise ineffective.  She feels fatigued, low energy. Not sleeping well. Lost 10 lb in 2 wk. Still taking Diamox 500mg  po BID.   Went to ER twice last wk. Then f/u with PCP a couple days ago. Recommended she f/u neurologist at this point. Aware I will send to MD for review and call back.

## 2023-12-02 NOTE — Telephone Encounter (Signed)
 [facility] called needing to speak to the RN or MD about the pt's imipramine (TOFRANIL) 25 MG tablet  She wanted to clarify that the provider knew that the imipramine (TOFRANIL) 25 MG tablet  and the traMADol (ULTRAM) 50 MG tablet may interact. Please advise.

## 2023-12-13 ENCOUNTER — Other Ambulatory Visit (HOSPITAL_COMMUNITY): Payer: Self-pay
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
## 2024-03-16 ENCOUNTER — Ambulatory Visit: Payer: Medicaid Other | Admitting: Neurology

## 2024-03-16 ENCOUNTER — Encounter: Payer: Self-pay | Admitting: Neurology
